# Patient Record
Sex: Female | Born: 1995 | Race: White | Hispanic: No | Marital: Single | Smoking: Current some day smoker
Health system: Southern US, Community
[De-identification: ages and names within clinical notes are randomized; demographics above are authoritative.]

---

## 2018-02-28 ENCOUNTER — Other Ambulatory Visit: Payer: Self-pay

## 2018-02-28 ENCOUNTER — Emergency Department (HOSPITAL_BASED_OUTPATIENT_CLINIC_OR_DEPARTMENT_OTHER)
Admission: EM | Admit: 2018-02-28 | Discharge: 2018-03-01 | Disposition: A | Discharge status: Home / Self Care | Payer: Self-pay | Attending: Emergency Medicine | Admitting: Emergency Medicine

## 2018-02-28 ENCOUNTER — Encounter (HOSPITAL_BASED_OUTPATIENT_CLINIC_OR_DEPARTMENT_OTHER): Payer: Self-pay | Admitting: *Deleted

## 2018-02-28 DIAGNOSIS — T7421XA Adult sexual abuse, confirmed, initial encounter: Secondary | ICD-10-CM | POA: Insufficient documentation

## 2018-02-28 DIAGNOSIS — F17210 Nicotine dependence, cigarettes, uncomplicated: Secondary | ICD-10-CM | POA: Insufficient documentation
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
## 2018-03-01 LAB — POC URINE PREG, ED: PREG TEST UR: NEGATIVE

## 2018-03-01 MED ORDER — LEVONORGESTREL 1.5 MG PO TABS
1.5000 mg | ORAL_TABLET | Freq: Once | ORAL | Status: DC
  Filled 2018-03-01: qty 1

## 2018-03-01 MED ORDER — ULIPRISTAL ACETATE 30 MG PO TABS
30.0000 mg | ORAL_TABLET | Freq: Once | ORAL | Status: AC
  Administered 2018-03-01: 30 mg via ORAL
  Filled 2018-03-01: qty 1

## 2018-03-01 NOTE — ED Notes (Signed)
Pt speaking with SANE RN.
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
## 2018-03-01 NOTE — ED Notes (Signed)
Pt discharged by SANE RN. Pt verbalized understanding of instructions, and was given Dentist by SANE.

## 2018-03-21 ENCOUNTER — Encounter (HOSPITAL_COMMUNITY): Payer: Self-pay

## 2018-03-21 ENCOUNTER — Emergency Department (HOSPITAL_COMMUNITY)
Admission: EM | Admit: 2018-03-21 | Discharge: 2018-03-21 | Disposition: A | Discharge status: Home / Self Care | Payer: Self-pay | Attending: Emergency Medicine | Admitting: Emergency Medicine

## 2018-03-21 DIAGNOSIS — F17210 Nicotine dependence, cigarettes, uncomplicated: Secondary | ICD-10-CM | POA: Insufficient documentation

## 2018-03-21 DIAGNOSIS — F1910 Other psychoactive substance abuse, uncomplicated: Secondary | ICD-10-CM | POA: Insufficient documentation

## 2018-03-21 DIAGNOSIS — F419 Anxiety disorder, unspecified: Secondary | ICD-10-CM | POA: Insufficient documentation

## 2018-03-21 DIAGNOSIS — F329 Major depressive disorder, single episode, unspecified: Secondary | ICD-10-CM | POA: Insufficient documentation

## 2018-03-21 LAB — ACETAMINOPHEN LEVEL

## 2018-03-21 LAB — BASIC METABOLIC PANEL
ANION GAP: 8 (ref 5–15)
BUN: 8 mg/dL (ref 6–20)
CALCIUM: 9.1 mg/dL (ref 8.9–10.3)
CHLORIDE: 105 mmol/L (ref 98–111)
CO2: 26 mmol/L (ref 22–32)
Creatinine, Ser: 0.59 mg/dL (ref 0.44–1.00)
GFR calc Af Amer: >60 mL/min (ref >60)
GFR calc non Af Amer: >60 mL/min (ref >60)
GLUCOSE: 114 mg/dL — AB (ref 70–99)
Potassium: 4 mmol/L (ref 3.5–5.1)
Sodium: 139 mmol/L (ref 135–145)

## 2018-03-21 LAB — CBC WITH DIFFERENTIAL/PLATELET
ABS IMMATURE GRANULOCYTES: 0.02 10*3/uL (ref 0.00–0.07)
Basophils Absolute: 0 10*3/uL (ref 0.0–0.1)
Basophils Relative: 0 %
Eosinophils Absolute: 0.2 10*3/uL (ref 0.0–0.5)
Eosinophils Relative: 3 %
HEMATOCRIT: 37.6 % (ref 36.0–46.0)
HEMOGLOBIN: 11.6 g/dL — AB (ref 12.0–15.0)
IMMATURE GRANULOCYTES: 0 %
LYMPHS ABS: 1.8 10*3/uL (ref 0.7–4.0)
LYMPHS PCT: 26 %
MCH: 26.9 pg (ref 26.0–34.0)
MCHC: 30.9 g/dL (ref 30.0–36.0)
MCV: 87.2 fL (ref 80.0–100.0)
MONOS PCT: 7 %
Monocytes Absolute: 0.5 10*3/uL (ref 0.1–1.0)
NEUTROS ABS: 4.4 10*3/uL (ref 1.7–7.7)
NEUTROS PCT: 64 %
PLATELETS: 368 10*3/uL (ref 150–400)
RBC: 4.31 MIL/uL (ref 3.87–5.11)
RDW: 12.9 % (ref 11.5–15.5)
WBC: 7 10*3/uL (ref 4.0–10.5)
nRBC: 0 % (ref 0.0–0.2)

## 2018-03-21 LAB — I-STAT BETA HCG BLOOD, ED (MC, WL, AP ONLY): I-stat hCG, quantitative: <5 m[IU]/mL (ref <5)

## 2018-03-21 LAB — SALICYLATE LEVEL: Salicylate Lvl: <7 mg/dL (ref 2.8–30.0)

## 2018-03-21 LAB — ETHANOL: Alcohol, Ethyl (B): <10 mg/dL (ref <10)
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
## 2018-03-21 NOTE — ED Triage Notes (Signed)
Patient states she has been detoxing from fentanyl and heroin x2 days. Patient states she overdosed x1 week ago. Patient c/o sweats, diarrhea, nausea, weakness, shob, anxiety. Patient wanting to get help and resources.
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
## 2018-03-21 NOTE — Discharge Instructions (Addendum)
You can follow-up with 1 of the referred outpatient counseling and substance abuse resources.  Additionally, you can follow-up with resources that peers support gave you.  Return to emergency department for any thoughts of wanting to hurt or kill yourself, thoughts of hurting someone else, difficulty breathing, chest pain or any other worsening concerning symptoms.
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
## 2019-05-24 MED ORDER — SIMETHICONE 80 MG PO CHEW
80.00 | CHEWABLE_TABLET | ORAL | Status: DC
Start: ? — End: 2019-05-24

## 2019-05-24 MED ORDER — LOPERAMIDE HCL 2 MG PO CAPS
4.00 | ORAL_CAPSULE | ORAL | Status: DC
Start: ? — End: 2019-05-24

## 2019-05-24 MED ORDER — ALUM & MAG HYDROXIDE-SIMETH 400-400-40 MG/5ML PO SUSP
15.00 | ORAL | Status: DC
Start: ? — End: 2019-05-24

## 2019-05-24 MED ORDER — GENERIC EXTERNAL MEDICATION
5.00 | Status: DC
Start: ? — End: 2019-05-24

## 2019-05-24 MED ORDER — SALINE NASAL SPRAY 0.65 % NA SOLN
1.00 | NASAL | Status: DC
Start: ? — End: 2019-05-24

## 2019-05-24 MED ORDER — DIPHENHYDRAMINE HCL 25 MG PO CAPS
50.00 | ORAL_CAPSULE | ORAL | Status: DC
Start: 2019-05-24 — End: 2019-05-24

## 2019-05-24 MED ORDER — IBUPROFEN 400 MG PO TABS
400.00 | ORAL_TABLET | ORAL | Status: DC
Start: ? — End: 2019-05-24

## 2019-05-24 MED ORDER — BUSPIRONE HCL 10 MG PO TABS
10.00 | ORAL_TABLET | ORAL | Status: DC
Start: 2019-05-24 — End: 2019-05-24

## 2019-05-24 MED ORDER — ZOLPIDEM TARTRATE 5 MG PO TABS
5.00 | ORAL_TABLET | ORAL | Status: DC
Start: ? — End: 2019-05-24

## 2019-05-24 MED ORDER — HALOPERIDOL 5 MG PO TABS
5.00 | ORAL_TABLET | ORAL | Status: DC
Start: ? — End: 2019-05-24

## 2019-05-24 MED ORDER — HYPROMELLOSE 0.5 % OP SOLN
2.00 | OPHTHALMIC | Status: DC
Start: ? — End: 2019-05-24

## 2019-05-24 MED ORDER — NICOTINE POLACRILEX 2 MG MT GUM
2.00 | CHEWING_GUM | OROMUCOSAL | Status: DC
Start: ? — End: 2019-05-24

## 2019-05-24 MED ORDER — ARIPIPRAZOLE 5 MG PO TABS
5.00 | ORAL_TABLET | ORAL | Status: DC
Start: 2019-05-25 — End: 2019-05-24

## 2019-05-24 MED ORDER — CITRATE OF MAGNESIA PO SOLN
300.00 | ORAL | Status: DC
Start: ? — End: 2019-05-24

## 2019-05-24 MED ORDER — NICOTINE 21 MG/24HR TD PT24
1.00 | MEDICATED_PATCH | TRANSDERMAL | Status: DC
Start: 2019-05-25 — End: 2019-05-24

## 2019-05-24 MED ORDER — ACETAMINOPHEN 325 MG PO TABS
650.00 | ORAL_TABLET | ORAL | Status: DC
Start: ? — End: 2019-05-24

## 2019-05-24 MED ORDER — DIPHENHYDRAMINE HCL 25 MG PO CAPS
50.00 | ORAL_CAPSULE | ORAL | Status: DC
Start: ? — End: 2019-05-24

## 2019-05-24 MED ORDER — LORAZEPAM 2 MG/ML IJ SOLN
2.00 | INTRAMUSCULAR | Status: DC
Start: ? — End: 2019-05-24

## 2019-05-24 MED ORDER — INFLUENZA VAC SPLIT QUAD 0.5 ML IM SUSY
0.50 | PREFILLED_SYRINGE | INTRAMUSCULAR | Status: DC
Start: ? — End: 2019-05-24

## 2019-05-24 MED ORDER — DSS 100 MG PO CAPS
100.00 | ORAL_CAPSULE | ORAL | Status: DC
Start: ? — End: 2019-05-24

## 2019-05-24 MED ORDER — LORAZEPAM 1 MG PO TABS
1.00 | ORAL_TABLET | ORAL | Status: DC
Start: ? — End: 2019-05-24

## 2019-05-24 MED ORDER — ZOLPIDEM TARTRATE 5 MG PO TABS
5.00 | ORAL_TABLET | ORAL | Status: DC
Start: 2019-05-24 — End: 2019-05-24

## 2019-05-24 MED ORDER — ONDANSETRON 4 MG PO TBDP
4.00 | ORAL_TABLET | ORAL | Status: DC
Start: ? — End: 2019-05-24

## 2019-05-24 MED ORDER — LORAZEPAM 0.5 MG PO TABS
0.50 | ORAL_TABLET | ORAL | Status: DC
Start: 2019-05-24 — End: 2019-05-24

## 2019-05-24 MED ORDER — SERTRALINE HCL 100 MG PO TABS
100.00 | ORAL_TABLET | ORAL | Status: DC
Start: 2019-05-25 — End: 2019-05-24

## 2019-05-24 MED ORDER — CALCIUM CARBONATE ANTACID 500 MG PO CHEW
CHEWABLE_TABLET | ORAL | Status: DC
Start: ? — End: 2019-05-24

## 2019-05-24 MED ORDER — CLONIDINE HCL 0.1 MG PO TABS
0.10 | ORAL_TABLET | ORAL | Status: DC
Start: ? — End: 2019-05-24

## 2019-05-31 ENCOUNTER — Inpatient Hospital Stay (HOSPITAL_COMMUNITY)
Admission: AD | Admit: 2019-05-31 | Discharge: 2019-06-14 | DRG: 885 | Disposition: A | Discharge status: Home / Self Care | Payer: Medicaid Other | Attending: Psychiatry | Admitting: Psychiatry

## 2019-05-31 ENCOUNTER — Other Ambulatory Visit: Payer: Self-pay

## 2019-05-31 ENCOUNTER — Other Ambulatory Visit: Payer: Self-pay | Admitting: Behavioral Health

## 2019-05-31 DIAGNOSIS — R45851 Suicidal ideations: Secondary | ICD-10-CM | POA: Diagnosis present

## 2019-05-31 DIAGNOSIS — F1290 Cannabis use, unspecified, uncomplicated: Secondary | ICD-10-CM | POA: Diagnosis present

## 2019-05-31 DIAGNOSIS — R251 Tremor, unspecified: Secondary | ICD-10-CM | POA: Diagnosis present

## 2019-05-31 DIAGNOSIS — G4700 Insomnia, unspecified: Secondary | ICD-10-CM | POA: Diagnosis present

## 2019-05-31 DIAGNOSIS — F2081 Schizophreniform disorder: Secondary | ICD-10-CM | POA: Diagnosis present

## 2019-05-31 DIAGNOSIS — F23 Brief psychotic disorder: Secondary | ICD-10-CM | POA: Diagnosis present

## 2019-05-31 DIAGNOSIS — F17210 Nicotine dependence, cigarettes, uncomplicated: Secondary | ICD-10-CM | POA: Diagnosis present

## 2019-05-31 DIAGNOSIS — Z79899 Other long term (current) drug therapy: Secondary | ICD-10-CM | POA: Diagnosis not present

## 2019-05-31 DIAGNOSIS — F4310 Post-traumatic stress disorder, unspecified: Secondary | ICD-10-CM | POA: Diagnosis present

## 2019-05-31 MED ORDER — PNEUMOCOCCAL VAC POLYVALENT 25 MCG/0.5ML IJ INJ: 0.5000 mL | INJECTION | INTRAMUSCULAR | Status: DC

## 2019-05-31 MED ORDER — ACETAMINOPHEN 325 MG PO TABS
650.0000 mg | ORAL_TABLET | Freq: Four times a day (QID) | ORAL | Status: DC | PRN
  Administered 2019-06-02 – 2019-06-13 (×3): 650 mg via ORAL
  Filled 2019-05-31 (×3): qty 2

## 2019-05-31 MED ORDER — ENSURE ENLIVE PO LIQD
237.0000 mL | Freq: Two times a day (BID) | ORAL | Status: DC
  Administered 2019-06-02 – 2019-06-14 (×22): 237 mL via ORAL

## 2019-05-31 MED ORDER — NICOTINE 21 MG/24HR TD PT24
21.0000 mg | MEDICATED_PATCH | Freq: Every day | TRANSDERMAL | Status: DC
  Administered 2019-06-01 – 2019-06-14 (×14): 21 mg via TRANSDERMAL
  Filled 2019-05-31 (×17): qty 1

## 2019-05-31 MED ORDER — HYDROXYZINE HCL 25 MG PO TABS
25.0000 mg | ORAL_TABLET | Freq: Three times a day (TID) | ORAL | Status: DC | PRN
  Administered 2019-05-31 – 2019-06-10 (×17): 25 mg via ORAL
  Filled 2019-05-31 (×19): qty 1

## 2019-05-31 MED ORDER — TRAZODONE HCL 50 MG PO TABS
50.0000 mg | ORAL_TABLET | Freq: Every day | ORAL | Status: DC
  Administered 2019-05-31 – 2019-06-09 (×8): 50 mg via ORAL
  Filled 2019-05-31 (×16): qty 1

## 2019-05-31 NOTE — BH Assessment (Signed)
Assessment Note From [place] chart:  Pt is a 24 year old female who presented to [facility] on a voluntary basis (transported by father) after being discharged from [facility] due to continued suicidal ideation and other symptoms.  Pt was assessed by TTS on 05/16/19.  At that time, she presented to [facility] with report of hallucination and suicidal ideation.  She also tested positive for amphetamines and benzos.  Pt stated that she lives in [place] with her boyfriend Corene Cornea, but it was difficult to understand Pt.  Pt was very tearful and tremulous during assessment.  She reported that she is suicidal with plan to drink bleach.  Pt also reported that she has thought about drinking bleach in the past.  Pt also reported ongoing visual hallucinations (''demons'') and auditory hallucination (voices urging her to harm herself).  Pt also endorsed despondency, insomnia, poor appetite, feelings of worthlessness.  Pt spoke very rapidly, and thought content was tangential and not responsive to questions asked.  Pt appeared confused and was not oriented to time.    In previous assessment, Pt was positive for amphetamines.  Pt's UDS was not available at time of assessment.  During assessment, Pt presented as alert.  She had fair eye contact.  Pt's demeanor was agitated.  Pt's mood was depressed.  Affect was panicky and tearful. Pt's speech was rapid and indicated word salad.  Thought content was tangential.  Pt's memory and concentration seemed fair.  Insight, judgment, and impulse control were impaired.  Onset: Over a week ago Medication Compliance: Yes Reason for seeking treatment: Family (Transported to hospital by father.) Presented With: Reports: Depression, Unclear Thinking, Bizarre Behavior, Suicidal Ideation, Visual Hallucinations, Auditory Hallucinations Apperance: Casual, Underweight, Stated Age Attitude: Bizarre Mood: Sad, Anxious, Excited Affect: Depressed,  Anxious Insight: Impaired Judgement: Impaired Memory Description: Reports: Recent Impaired, Remote Impaired Depressive Symptoms: Reports: Crying episodes, Poor Concentration, Sleep changes, Worthlessness, Despondent, Insomnia, Self-pity Manic/Hypomanic Symptoms: Reports: Decreased Coping Skills Suicidal Attempt: Reports: Other Suicidal Intent: Reports: Suicidal Plan Suicidal Plan: Reports: Other (Poison self with bleach) Risk Factors: Reports: Psychiatric Diagnosis and Treatment, Family History of Suicide or Mental Illness, Substance Abuse and Dependence, Psychosis: Hallucinations or Delusions Risk for physical violence towards others: Reports: Not an issue Homicidal Ideation: No Does patient have access to weapons?: No Criminal charges pending: No Court Date (if yes when): No Hallucination Type: Reports: Visual, Auditory, Command Hallucinations affecting more than one sensory system: Yes History of Abuse: Yes: Having thoughts of harming yourself or taking your life?, Hx Substance Use Disorder Hx Substance Use Treatment: UNKNOWN Able to Care for Self: No Able to Control Self: No  - Medical History Cardiac History: Reports: Cardiac Catheterization Psychological History: Reports: Substance Use Disorder.  Denies: Depression Social History: Reports: Substance Use Disorder.  Denies: Tobacco Use in the Last 30 Days Surgical History: Reports: Cardiac Catheterization.  Denies: Hysterectomy  - Diagnosis Brief Psychotic Disorder; r/o Bipolar I; r/o MDD with psychotic features; r/o amphetamine use disorder  - Disposition:  Consulted with T. Money, NP who determined that Pt meets inpatient criteria. Diagnosis - Patient Problems:  Current Active Problems  Bizarre behavior (Acute) R46.2 Suicidal ideation (Acute) R45.851   Does the patient meet criteria for Involuntary Commitment?: N/A Recommend /or Refer: Inpatient Therapy Action/Disposition Plan:  Consulted with T. Money, NP who  determined that Pt meets inpatient criteria. Lakin Rhine Grove is an 24 y.o. female who presented to [facility] on a voluntary basis (transported by father) after being discharged  from [facility] due to continued suicidal ideation and other symptoms.     Diagnosis: Brief Psychotic Disorder; r/o Bipolar I; r/o MDD with psychotic features; r/o amphetamine use disorder  Past Medical History: No past medical history on file.  No past surgical history on file.  Family History: No family history on file.  Social History:  reports that she has been smoking cigarettes. She has never used smokeless tobacco. She reports previous alcohol use. She reports current drug use. Frequency: 7.00 times per week. Drugs: Fentanyl and Heroin.  Additional Social History:  Alcohol / Drug Use Pain Medications: See MAR Prescriptions: See MAR Over the Counter: See MAR History of alcohol / drug use?: Yes Longest period of sobriety (when/how long): UTA Negative Consequences of Use: Work / School Substance #1 Name of Substance 1: THC 1 - Frequency: UTA Substance #2 Name of Substance 2: Amphetamines 2 - Frequency: UTA Substance #3 Name of Substance 3: benzos 3 - Frequency: UTA 3 - Last Use / Amount: UTA  CIWA:   COWS:    Allergies: No Known Allergies  Home Medications:  No medications prior to admission.    OB/GYN Status:  No LMP recorded. (Menstrual status: Irregular Periods).  General Assessment Data Location of Assessment: [facility] TTS Assessment: Out of system Is this a Tele or Face-to-Face Assessment?: Tele Assessment Is this an Initial Assessment or a Re-assessment for this encounter?: Initial Assessment Patient Accompanied by:: N/A Language Other than English: No Living Arrangements: Other (Comment) What gender do you identify as?: Female Marital status: Single Living Arrangements: Non-relatives/Friends(with boyfriend, Barbara Cower) Can pt return to  current living arrangement?: (UTA) Admission Status: Voluntary Is patient capable of signing voluntary admission?: Yes Referral Source: Self/Family/Friend Insurance type: medicaid     Crisis Care Plan Living Arrangements: Non-relatives/Friends(with boyfriend, Barbara Cower) Name of Psychiatrist: none  Education Status Is patient currently in school?: No Is the patient employed, unemployed or receiving disability?: Unemployed  Risk to self with the past 6 months Suicidal Ideation: Yes-Currently Present Has patient been a risk to self within the past 6 months prior to admission? : No Suicidal Intent: No-Not Currently/Within Last 6 Months Has patient had any suicidal intent within the past 6 months prior to admission? : Yes Is patient at risk for suicide?: Yes Suicidal Plan?: Yes-Currently Present(drink bleach) Specify Current Suicidal Plan: drink bleach What has been your use of drugs/alcohol within the last 12 months?: meth, benzos and thc Previous Attempts/Gestures: (UTA) Other Self Harm Risks: psychosis; substance abuse, current SI Triggers for Past Attempts: Unknown Intentional Self Injurious Behavior: (UTA) Family Suicide History: Unable to assess Recent stressful life event(s): (drug use) Persecutory voices/beliefs?: Yes Depression: Yes Depression Symptoms: Despondent, Insomnia, Isolating, Feeling worthless/self pity, Loss of interest in usual pleasures, Tearfulness Substance abuse history and/or treatment for substance abuse?: Yes Suicide prevention information given to non-admitted patients: Not applicable  Risk to Others within the past 6 months Homicidal Ideation: No Does patient have any lifetime risk of violence toward others beyond the six months prior to admission? : No Thoughts of Harm to Others: No Current Homicidal Intent: No Current Homicidal Plan: No Access to Homicidal Means: No History of harm to others?: No Assessment of Violence: None Noted Does patient have  access to weapons?: (UTA) Criminal Charges Pending?: No Does patient have a court date: No Is patient on probation?: No  Psychosis Hallucinations: Auditory, Visual Delusions: Unspecified  Mental Status Report Appearance/Hygiene: Disheveled Eye Contact: Fair Motor Activity: Restlessness Speech: Incoherent, Pressured  Level of Consciousness: Restless, Crying Mood: Sad Affect: Sad Anxiety Level: Moderate Thought Processes: Tangential Judgement: Impaired Orientation: Person, Place, Situation Obsessive Compulsive Thoughts/Behaviors: None  Cognitive Functioning Concentration: Poor Memory: Unable to Assess Is patient IDD: No Insight: Poor Impulse Control: Poor Sleep: Decreased Vegetative Symptoms: Unable to Assess  ADLScreening [facility] Assessment Services) Patient's cognitive ability adequate to safely complete daily activities?: Yes Patient able to express need for assistance with ADLs?: Yes Independently performs ADLs?: Yes (appropriate for developmental age)  Prior Inpatient Therapy Prior Inpatient Therapy: Yes Prior Therapy Dates: 1/21 Prior Therapy Facilty/Provider(s): [facility] Reason for Treatment: psychosis/depression  Prior Outpatient Therapy Prior Outpatient Therapy: No Does patient have an ACCT team?: No Does patient have Intensive In-House Services?  : No Does patient have [facility] services? : No Does patient have P4CC services?: No  ADL Screening (condition at time of admission) Patient's cognitive ability adequate to safely complete daily activities?: Yes Is the patient deaf or have difficulty hearing?: No Does the patient have difficulty seeing, even when wearing glasses/contacts?: No Does the patient have difficulty concentrating, remembering, or making decisions?: Yes Patient able to express need for assistance with ADLs?: Yes Does the patient have difficulty dressing or bathing?: No Independently performs ADLs?: Yes (appropriate for developmental  age) Does the patient have difficulty walking or climbing stairs?: No Weakness of Legs: None Weakness of Arms/Hands: None  Home Assistive Devices/Equipment Home Assistive Devices/Equipment: None  Therapy Consults (therapy consults require a physician order) PT Evaluation Needed: No OT Evalulation Needed: No SLP Evaluation Needed: No Abuse/Neglect Assessment (Assessment to be complete while patient is alone) Abuse/Neglect Assessment Can Be Completed: Unable to assess, patient is non-responsive or altered mental status Values / Beliefs Cultural Requests During Hospitalization: None Spiritual Requests During Hospitalization: None Consults Spiritual Care Consult Needed: No Transition of Care Team Consult Needed: No Advance Directives (For Healthcare) Does Patient Have a Medical Advance Directive?: No Would patient like information on creating a medical advance directive?: No - Patient declined          Disposition: Consulted with T. Money, NP who determined that Pt meets inpatient criteria. Disposition Initial Assessment Completed for this Encounter: Yes Disposition of Patient: Admit  On Site Evaluation by:   Reviewed with Physician:    Clearnce Sorrel 05/31/2019 5:07 PM

## 2019-06-01 ENCOUNTER — Encounter (HOSPITAL_COMMUNITY): Payer: Self-pay | Admitting: Psychiatry

## 2019-06-01 DIAGNOSIS — F23 Brief psychotic disorder: Secondary | ICD-10-CM

## 2019-06-01 DIAGNOSIS — F2081 Schizophreniform disorder: Secondary | ICD-10-CM

## 2019-06-01 LAB — LIPID PANEL
Cholesterol: 178 mg/dL (ref 0–200)
HDL: 54 mg/dL (ref >40)
LDL Cholesterol: 112 mg/dL — ABNORMAL HIGH (ref 0–99)
Total CHOL/HDL Ratio: 3.3 RATIO
Triglycerides: 59 mg/dL (ref <150)
VLDL: 12 mg/dL (ref 0–40)

## 2019-06-01 LAB — TSH: TSH: 1.32 u[IU]/mL (ref 0.350–4.500)

## 2019-06-01 MED ORDER — TEMAZEPAM 15 MG PO CAPS
15.0000 mg | ORAL_CAPSULE | Freq: Every evening | ORAL | Status: DC | PRN
  Administered 2019-06-01 – 2019-06-02 (×2): 15 mg via ORAL
  Filled 2019-06-01 (×2): qty 1

## 2019-06-01 MED ORDER — PRENATAL MULTIVITAMIN CH
1.0000 | ORAL_TABLET | Freq: Every day | ORAL | Status: DC
  Administered 2019-06-01 – 2019-06-14 (×14): 1 via ORAL
  Filled 2019-06-01 (×18): qty 1

## 2019-06-01 MED ORDER — BENZTROPINE MESYLATE 0.5 MG PO TABS
0.5000 mg | ORAL_TABLET | Freq: Two times a day (BID) | ORAL | Status: DC
  Administered 2019-06-01 – 2019-06-05 (×8): 0.5 mg via ORAL
  Filled 2019-06-01 (×13): qty 1

## 2019-06-01 MED ORDER — OMEGA-3-ACID ETHYL ESTERS 1 G PO CAPS
1.0000 g | ORAL_CAPSULE | Freq: Two times a day (BID) | ORAL | Status: DC
  Administered 2019-06-01 – 2019-06-14 (×26): 1 g via ORAL
  Filled 2019-06-01 (×33): qty 1

## 2019-06-01 MED ORDER — RISPERIDONE 2 MG PO TABS
2.0000 mg | ORAL_TABLET | Freq: Two times a day (BID) | ORAL | Status: DC
  Administered 2019-06-01 (×2): 2 mg via ORAL
  Filled 2019-06-01 (×5): qty 1
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
## 2019-06-01 NOTE — Progress Notes (Signed)
Recreation Therapy Notes  INPATIENT RECREATION THERAPY ASSESSMENT  Patient Details Name: Keera Altidor MRN: [id number] DOB: [birth year] Today's Date: 06/01/2019       Information Obtained From: Patient  Able to Participate in Assessment/Interview: Yes  Patient Presentation: Labile  Reason for Admission (Per Patient): Other (Comments), Substance Abuse("they think I'm crazy")  Patient Stressors: (Drugs)  Coping Skills:   Isolation, TV, Aggression, Music, Exercise, Meditate, Deep Breathing, Substance Abuse, Impulsivity, Art, Prayer, Avoidance, Read  Leisure Interests (2+):  Individual - Other (Comment)(Drink water/milk)  Frequency of Recreation/Participation: Other (Comment)(Daily)  Awareness of Community Resources:  Yes  Community Resources:  Gym  Current Use: No  If no, Barriers?: Other (Comment)(Pt stated she hadn't started going yet.)  Expressed Interest in [employer] Information: No  [place] of Residence:  [place]  Patient Main Form of Transportation: Walk  Patient Strengths:  Banker; Meditation  Patient Identified Areas of Improvement:  Teach self how to breath; Don't roll eyes  Patient Goal for Hospitalization:  "clean my ears out and I need eye drops"  Current SI (including self-harm):  No  Current HI:  No  Current AVH: No  Staff Intervention Plan: Group Attendance, Collaborate with Interdisciplinary Treatment Team  Consent to Intern Participation: N/A     Caroll Rancher, LRT/CTRS  Lillia Abed, Anwar Crill A 06/01/2019, 11:58 AM

## 2019-06-01 NOTE — Tx Team (Signed)
Initial Treatment Plan 06/01/2019 2:36 AM Julie Gentry [facility]:[id number]    PATIENT STRESSORS: Legal issue Medication change or noncompliance   PATIENT STRENGTHS: Average or above average intelligence Physical Health Supportive family/friends   PATIENT IDENTIFIED PROBLEMS: Depression  Suicidal ideation  Anxiety  Medication non-compliance                DISCHARGE CRITERIA:  Improved stabilization in mood, thinking, and/or behavior Motivation to continue treatment in a less acute level of care Verbal commitment to aftercare and medication compliance  PRELIMINARY DISCHARGE PLAN: Attend PHP/IOP Outpatient therapy Return to previous living arrangement  PATIENT/FAMILY INVOLVEMENT: This treatment plan has been presented to and reviewed with the patient, Julie Gentry, and/or family member.  The patient and family have been given the opportunity to ask questions and make suggestions.  Victorino December, RN 06/01/2019, 2:36 AM
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
## 2019-06-01 NOTE — Tx Team (Signed)
Interdisciplinary Treatment and Diagnostic Plan Update  06/01/2019 Time of Session: 10:30 am  Julie Gentry MRN: [id number]  Principal Diagnosis: <principal problem not specified>  Secondary Diagnoses: Active Problems:   Brief psychotic disorder ([facility])   Schizophreniform disorder ([facility])   Current Medications:  Current Facility-Administered Medications  Medication Dose Route Frequency Provider Last Rate Last Admin  . acetaminophen (TYLENOL) tablet 650 mg  650 mg Oral Q6H PRN Anike, Adaku C, NP      . benztropine (COGENTIN) tablet 0.5 mg  0.5 mg Oral BID Malvin Johns, MD   0.5 mg at 06/01/19 0855  . feeding supplement (ENSURE ENLIVE) (ENSURE ENLIVE) liquid 237 mL  237 mL Oral BID BM Malvin Johns, MD      . hydrOXYzine (ATARAX/VISTARIL) tablet 25 mg  25 mg Oral TID PRN Anike, Adaku C, NP   25 mg at 05/31/19 2255  . nicotine (NICODERM CQ - dosed in mg/24 hours) patch 21 mg  21 mg Transdermal Daily Malvin Johns, MD   21 mg at 06/01/19 0855  . omega-3 acid ethyl esters (LOVAZA) capsule 1 g  1 g Oral BID Malvin Johns, MD   1 g at 06/01/19 0855  . pneumococcal 23 valent vaccine (PNEUMOVAX-23) injection 0.5 mL  0.5 mL Intramuscular Tomorrow-1000 Malvin Johns, MD      . prenatal multivitamin tablet 1 tablet  1 tablet Oral Daily Malvin Johns, MD   1 tablet at 06/01/19 0855  . risperiDONE (RISPERDAL) tablet 2 mg  2 mg Oral BID Malvin Johns, MD   2 mg at 06/01/19 0855  . temazepam (RESTORIL) capsule 15 mg  15 mg Oral QHS PRN Malvin Johns, MD      . traZODone (DESYREL) tablet 50 mg  50 mg Oral QHS Anike, Adaku C, NP   50 mg at 05/31/19 2228   PTA Medications: Medications Prior to Admission  Medication Sig Dispense Refill Last Dose  . ARIPiprazole (ABILIFY) 5 MG tablet Take 1 tablet by mouth daily.     . busPIRone (BUSPAR) 10 MG tablet Take 1 tablet by mouth 2 (two) times daily.     . hydrOXYzine (ATARAX/VISTARIL) 50 MG tablet Take 1 tablet by mouth 3 (three) times daily.     . sertraline  (ZOLOFT) 100 MG tablet Take 1 tablet by mouth daily.       Patient Stressors: Legal issue Medication change or noncompliance  Patient Strengths: Average or above average intelligence Physical Health Supportive family/friends  Treatment Modalities: Medication Management, Group therapy, Case management,  1 to 1 session with clinician, Psychoeducation, Recreational therapy.   Physician Treatment Plan for Primary Diagnosis: <principal problem not specified> Long Term Goal(s): Improvement in symptoms so as ready for discharge Improvement in symptoms so as ready for discharge   Short Term Goals: Ability to identify and develop effective coping behaviors will improve Ability to maintain clinical measurements within normal limits will improve Compliance with prescribed medications will improve Ability to identify and develop effective coping behaviors will improve Ability to maintain clinical measurements within normal limits will improve Compliance with prescribed medications will improve  Medication Management: Evaluate patient's response, side effects, and tolerance of medication regimen.  Therapeutic Interventions: 1 to 1 sessions, Unit Group sessions and Medication administration.  Evaluation of Outcomes: Progressing  Physician Treatment Plan for Secondary Diagnosis: Active Problems:   Brief psychotic disorder ([facility])   Schizophreniform disorder ([facility])  Long Term Goal(s): Improvement in symptoms so as ready for discharge Improvement in symptoms so as ready for discharge  Short Term Goals: Ability to identify and develop effective coping behaviors will improve Ability to maintain clinical measurements within normal limits will improve Compliance with prescribed medications will improve Ability to identify and develop effective coping behaviors will improve Ability to maintain clinical measurements within normal limits will improve Compliance with prescribed medications will  improve     Medication Management: Evaluate patient's response, side effects, and tolerance of medication regimen.  Therapeutic Interventions: 1 to 1 sessions, Unit Group sessions and Medication administration.  Evaluation of Outcomes: Progressing   RN Treatment Plan for Primary Diagnosis: <principal problem not specified> Long Term Goal(s): Knowledge of disease and therapeutic regimen to maintain health will improve  Short Term Goals: Ability to participate in decision making will improve, Ability to verbalize feelings will improve, Ability to disclose and discuss suicidal ideas, Ability to identify and develop effective coping behaviors will improve and Compliance with prescribed medications will improve  Medication Management: RN will administer medications as ordered by provider, will assess and evaluate patient's response and provide education to patient for prescribed medication. RN will report any adverse and/or side effects to prescribing provider.  Therapeutic Interventions: 1 on 1 counseling sessions, Psychoeducation, Medication administration, Evaluate responses to treatment, Monitor vital signs and CBGs as ordered, Perform/monitor CIWA, COWS, AIMS and Fall Risk screenings as ordered, Perform wound care treatments as ordered.  Evaluation of Outcomes: Progressing   LCSW Treatment Plan for Primary Diagnosis: <principal problem not specified> Long Term Goal(s): Safe transition to appropriate next level of care at discharge, Engage patient in therapeutic group addressing interpersonal concerns.  Short Term Goals: Engage patient in aftercare planning with referrals and resources and Increase skills for wellness and recovery  Therapeutic Interventions: Assess for all discharge needs, 1 to 1 time with Social worker, Explore available resources and support systems, Assess for adequacy in community support network, Educate family and significant other(s) on suicide prevention, Complete  Psychosocial Assessment, Interpersonal group therapy.  Evaluation of Outcomes: Progressing   Progress in Treatment: Attending groups: No. new to unit  Participating in groups: No. Taking medication as prescribed: No. new to unit  Toleration medication: No. Family/Significant other contact made: No, will contact:  if given consent  Patient understands diagnosis: Yes. Discussing patient identified problems/goals with staff: Yes. Medical problems stabilized or resolved: Yes. Denies suicidal/homicidal ideation: Yes. Issues/concerns per patient self-inventory: No. Other:   New problem(s) identified: No, Describe:  none   New Short Term/Long Term Goal(s): Medication stabilization, elimination of SI thoughts, and development of a comprehensive mental wellness plan.   Patient Goals:  "to get better"  Discharge Plan or Barriers: CSW will continue to follow up for appropriate referrals and possible discharge planning  Reason for Continuation of Hospitalization: Hallucinations Medication stabilization Suicidal ideation  Estimated Length of Stay: 3-5 days   Attendees: Patient: Julie Gentry  06/01/2019   Physician:  06/01/2019   Nursing:  06/01/2019   RN Care Manager: 06/01/2019   Social Worker: Ardelle Anton, LCSW 06/01/2019   Recreational Therapist:  06/01/2019   Other: Ovidio Kin, MSW intern  06/01/2019   Other:  06/01/2019   Other: 06/01/2019     Scribe for Treatment Team: Billey Chang, Student-Social Work 06/01/2019 10:50 AM

## 2019-06-01 NOTE — Progress Notes (Signed)
MSW intern attempted to complete assessment with pt. Pt made random statements while mumbling and began pacing the room.   Earlyne Iba, MSW intern
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
## 2019-06-01 NOTE — Progress Notes (Signed)
Patient has been complaining of active hallucinations.  Patient has been pacing the hall with a fixed smile on her face giggling and inappropriately at times. Patient has had no incidents of behavioral dyscontrol.   Assess patient for safety, engage patient in 1:1 staff talks.   Continue to monitor as planned.

## 2019-06-01 NOTE — Progress Notes (Signed)
Nutrition Brief Note RD working remotely.   Patient identified on the Malnutrition Screening Tool (MST) Report.  Wt Readings from Last 15 Encounters:  05/31/19 47.5 kg  02/28/18 47.6 kg    Body mass index is 19.15 kg/m. Patient meets criteria for normal weight based on current BMI. Weight has been stable for >1 year.   Current diet order is Regular and patient is eating as desired for meals and snacks. Labs and medications reviewed.   Ensure Enlive was ordered BID per ONS protocol, each supplement provides 350 kcal and 20 grams protein. No nutrition interventions warranted at this time. If nutrition issues arise, please consult RD.     Trenton Gammon, MS, RD, LDN, CNSC Inpatient Clinical Dietitian RD pager # available in [facility]  After hours/weekend pager # available in [facility]

## 2019-06-01 NOTE — Progress Notes (Signed)
Recreation Therapy Notes  Date: 2.10.21 Time: 1000 Location: [address] Dayroom  Group Topic: Communication  Goal Area(s) Addresses:  Patient will effectively communicate with peers in group.  Patient will verbalize benefit of healthy communication.  Intervention: Group Discussion  Activity: Check-In.  Patients were to discuss any concerns or positive achievements they may have.  Education: Communication, Discharge Planning  Education Outcome: Acknowledges understanding/In group clarification offered/Needs additional education.   Clinical Observations/Feedback: Pt did not attend group.    Caroll Rancher, LRT/CTRS         Caroll Rancher A 06/01/2019 11:46 AM

## 2019-06-01 NOTE — Progress Notes (Signed)
Pt states that she was given prescriptions for meds but didn't fill them, stating "I don't know why."  ARIPiprazole (ABILIFY) 5 MG tablet   Take 1 tablet (5 mg total) by mouth daily. 30 tablet    busPIRone (BUSPAR) 10 MG tablet   Take 1 tablet (10 mg total) by mouth Two (2) times a day. 60 tablet    hydroxyzine (ATARAX) 50 MG tablet   Take 1 tablet (50 mg total) by mouth Three (3) times a day. 90 capsule    sertraline (ZOLOFT) 100 MG tablet   Take 1 tablet (100 mg total) by mouth daily. 30 tablet     This is the med list of home meds per records from [facility].

## 2019-06-02 LAB — HEMOGLOBIN A1C
Hgb A1c MFr Bld: 5.1 % (ref 4.8–5.6)
Mean Plasma Glucose: 100 mg/dL

## 2019-06-02 LAB — PROLACTIN: Prolactin: 12.7 ng/mL (ref 4.8–23.3)

## 2019-06-02 MED ORDER — RISPERIDONE 3 MG PO TABS
3.0000 mg | ORAL_TABLET | Freq: Two times a day (BID) | ORAL | Status: DC
  Administered 2019-06-02 – 2019-06-03 (×3): 3 mg via ORAL
  Filled 2019-06-02 (×6): qty 1

## 2019-06-02 NOTE — Progress Notes (Addendum)
[facility] MD Progress Note  06/02/2019 7:36 AM Julie Gentry  MRN:  [id number] Principal Problem: <principal problem not specified> Diagnosis: Active Problems:   Brief psychotic disorder (HCC)   Schizophreniform disorder (HCC)  Subjective:  Julie Gentry is a 24 year old female that presents this morning with visual and auditory hallucinations. She says that she frequently sees shadows that trigger her to react violently. She is also still experiencing auditory hallucinations that range from voices "talking crap" to simply whistling. She states this is very distracting and she paces to keep her mind busy. She has attached herself to another patient and it appears they are feeding into each others delusions. Patient has poor insight, judgement, and attention. She denies any current suicidal ideations; however, does state that her suicidal thoughts normally come randomly and without trigger. She admits to substance abuse; however, states she does not feel like she's having any withdrawal symptoms at this time.  Total Time spent with patient: 15 minutes  Past Psychiatric History: Inpatient psychiatric hospitalization on 05/16/2019  Past Medical History: History reviewed. No pertinent past medical history. History reviewed. No pertinent surgical history. Family History: History reviewed. No pertinent family history. Family Psychiatric  History: History reviewed. No pertinent family history.  Social History:  Social History   Substance and Sexual Activity  Alcohol Use Not Currently     Social History   Substance and Sexual Activity  Drug Use Yes  . Types: Marijuana   Comment: occasionally    Social History   Socioeconomic History  . Marital status: Single    Spouse name: Not on file  . Number of children: Not on file  . Years of education: 36  . Highest education level: High school graduate  Occupational History  . Occupation: Unemployed  Tobacco Use  . Smoking status: Current  Some Day Smoker    Packs/day: 1.00    Types: Cigarettes  . Smokeless tobacco: Never Used  Substance and Sexual Activity  . Alcohol use: Not Currently  . Drug use: Yes    Types: Marijuana    Comment: occasionally  . Sexual activity: Yes    Birth control/protection: None  Other Topics Concern  . Not on file  Social History Narrative   Pt is unemployed; lives in [place] with her father   Social Determinants of Health   Financial Resource Strain:   . Difficulty of Paying Living Expenses: Not on file  Food Insecurity:   . Worried About Programme researcher, broadcasting/film/video in the Last Year: Not on file  . Ran Out of Food in the Last Year: Not on file  Transportation Needs:   . Lack of Transportation (Medical): Not on file  . Lack of Transportation (Non-Medical): Not on file  Physical Activity:   . Days of Exercise per Week: Not on file  . Minutes of Exercise per Session: Not on file  Stress:   . Feeling of Stress : Not on file  Social Connections:   . Frequency of Communication with Friends and Family: Not on file  . Frequency of Social Gatherings with Friends and Family: Not on file  . Attends Religious Services: Not on file  . Active Member of Clubs or Organizations: Not on file  . Attends Banker Meetings: Not on file  . Marital Status: Not on file   Additional Social History:    Pain Medications: See MAR Prescriptions: See MAR Over the Counter: See MAR History of alcohol / drug use?: Yes Longest period of sobriety (when/how  long): UTA Negative Consequences of Use: Work / School Name of Substance 1: THC 1 - Frequency: UTA Name of Substance 2: Amphetamines 2 - Frequency: UTA Name of Substance 3: benzos 3 - Frequency: UTA 3 - Last Use / Amount: UTA      Sleep: Good  Appetite:  Fair  Current Medications: Current Facility-Administered Medications  Medication Dose Route Frequency Provider Last Rate Last Admin  . acetaminophen (TYLENOL) tablet 650 mg  650 mg Oral Q6H  PRN Anike, Adaku C, NP      . benztropine (COGENTIN) tablet 0.5 mg  0.5 mg Oral BID Malvin Johns, MD   0.5 mg at 06/01/19 1705  . feeding supplement (ENSURE ENLIVE) (ENSURE ENLIVE) liquid 237 mL  237 mL Oral BID BM Malvin Johns, MD      . hydrOXYzine (ATARAX/VISTARIL) tablet 25 mg  25 mg Oral TID PRN Anike, Adaku C, NP   25 mg at 06/01/19 2226  . nicotine (NICODERM CQ - dosed in mg/24 hours) patch 21 mg  21 mg Transdermal Daily Malvin Johns, MD   21 mg at 06/01/19 0855  . omega-3 acid ethyl esters (LOVAZA) capsule 1 g  1 g Oral BID Malvin Johns, MD   1 g at 06/01/19 1705  . pneumococcal 23 valent vaccine (PNEUMOVAX-23) injection 0.5 mL  0.5 mL Intramuscular Tomorrow-1000 Malvin Johns, MD      . prenatal multivitamin tablet 1 tablet  1 tablet Oral Daily Malvin Johns, MD   1 tablet at 06/01/19 0855  . risperiDONE (RISPERDAL) tablet 3 mg  3 mg Oral BID Malvin Johns, MD      . temazepam (RESTORIL) capsule 15 mg  15 mg Oral QHS PRN Malvin Johns, MD   15 mg at 06/01/19 2043  . traZODone (DESYREL) tablet 50 mg  50 mg Oral QHS Anike, Adaku C, NP   50 mg at 06/01/19 2043    Lab Results:  Results for orders placed or performed during the hospital encounter of 05/31/19 (from the past 48 hour(s))  Hemoglobin A1c     Status: None   Collection Time: 06/01/19  6:12 AM  Result Value Ref Range   Hgb A1c MFr Bld 5.1 4.8 - 5.6 %    Comment: (NOTE)         Prediabetes: 5.7 - 6.4         Diabetes: >6.4         Glycemic control for adults with diabetes: <7.0    Mean Plasma Glucose 100 mg/dL    Comment: (NOTE) Performed At: [facility] [address] [place], [place] [id number] Jolene Schimke MD VO:[id number]   Lipid panel     Status: Abnormal   Collection Time: 06/01/19  6:12 AM  Result Value Ref Range   Cholesterol 178 0 - 200 mg/dL   Triglycerides 59 <993 mg/dL   HDL 54 >71 mg/dL   Total CHOL/HDL Ratio 3.3 RATIO   VLDL 12 0 - 40 mg/dL   LDL Cholesterol 696 (H) 0 - 99 mg/dL    Comment:         Total Cholesterol/HDL:CHD Risk Coronary Heart Disease Risk Table                     Men   Women  1/2 Average Risk   3.4   3.3  Average Risk       5.0   4.4  2 X Average Risk   9.6   7.1  3 X Average  Risk  23.4   11.0        Use the calculated Patient Ratio above and the CHD Risk Table to determine the patient's CHD Risk.        ATP III CLASSIFICATION (LDL):  <100     mg/dL   Optimal  240-973  mg/dL   Near or Above                    Optimal  130-159  mg/dL   Borderline  532-992  mg/dL   High  >426     mg/dL   Very High Performed at [facility], [address], [place], [place] [address]   Prolactin     Status: None   Collection Time: 06/01/19  6:12 AM  Result Value Ref Range   Prolactin 12.7 4.8 - 23.3 ng/mL    Comment: (NOTE) Performed At: [facility] [address] [place], [place] [id number] Jolene Schimke MD QJ:[id number]   TSH     Status: None   Collection Time: 06/01/19  6:12 AM  Result Value Ref Range   TSH 1.320 0.350 - 4.500 uIU/mL    Comment: Performed by a 3rd Generation assay with a functional sensitivity of <=0.01 uIU/mL. Performed at [facility], [address] [place], [place] [address]     Blood Alcohol level:  Lab Results  Component Value Date   ETH <10 03/21/2018    Metabolic Disorder Labs: Lab Results  Component Value Date   HGBA1C 5.1 06/01/2019   MPG 100 06/01/2019   Lab Results  Component Value Date   PROLACTIN 12.7 06/01/2019   Lab Results  Component Value Date   CHOL 178 06/01/2019   TRIG 59 06/01/2019   HDL 54 06/01/2019   CHOLHDL 3.3 06/01/2019   VLDL 12 06/01/2019   LDLCALC 112 (H) 06/01/2019    Physical Findings: AIMS: Facial and Oral Movements Muscles of Facial Expression: None, normal Lips and Perioral Area: None, normal Jaw: None, normal Tongue: None, normal,Extremity Movements Upper (arms, wrists, hands, fingers): None, normal Lower (legs, knees, ankles,  toes): None, normal, Trunk Movements Neck, shoulders, hips: None, normal, Overall Severity Severity of abnormal movements (highest score from questions above): None, normal Incapacitation due to abnormal movements: None, normal Patient's awareness of abnormal movements (rate only patient's report): No Awareness, Dental Status Current problems with teeth and/or dentures?: No Does patient usually wear dentures?: No  CIWA:    COWS:     Musculoskeletal: Strength & Muscle Tone: within normal limits Gait & Station: normal Patient leans: N/A  Psychiatric Specialty Exam: Physical Exam  Review of Systems  Blood pressure 119/84, pulse (!) 114, temperature 97.7 F (36.5 C), temperature source Oral, resp. rate 18, height 5\' 2"  (1.575 m), weight 47.5 kg.Body mass index is 19.15 kg/m.  General Appearance: Casual  Eye Contact:  Fair  Speech:  Clear and Coherent  Volume:  Decreased  Mood:  Euthymic  Affect:  Flat  Thought Process:  Disorganized, Irrelevant and Descriptions of Associations: Loose  Orientation:  Other:  Person, Place, but unsure of date/year  Thought Content:  Illogical and Hallucinations: Auditory Visual  Suicidal Thoughts:  None this morning but expressed previous thoughts of self-harm  Homicidal Thoughts:  No  Memory:  Immediate;   Poor Recent;   Poor  Judgement:  Impaired  Insight:  Shallow  Psychomotor Activity:  Normal  Concentration:  Concentration: Poor and Attention Span: Fair  Recall:  Poor  Fund of  Knowledge:  Poor  Language:  Normal speech rate and tone; however, frequetly makes random statements that don't make sense  Akathisia:  Negative  Handed:  Right  AIMS (if indicated):     Assets:  Communication Skills Housing Leisure Time Physical Health Resilience Social Support  ADL's:  Intact  Cognition:  WNL  Sleep:  Number of Hours: 7    Treatment Plan Summary: Daily contact with patient to assess and evaluate symptoms and progress in treatment  -  Continue inpatient hospitalization - Continue current medications - Continue reality and rehab based psychotherapy  Fonnie Mu, Medical Student 06/02/2019, 7:36 AM

## 2019-06-02 NOTE — [facility] Group Notes (Signed)
[facility] LCSW Group Therapy Note  Date/Time: 06/02/2019 @ 1:30pm  Type of Therapy and Topic:  Group Therapy:  Overcoming Obstacles  Participation Level:  [facility] PARTICIPATION LEVEL: Did Not Attend  Description of Group:    In this group patients will be encouraged to explore what they see as obstacles to their own wellness and recovery. They will be guided to discuss their thoughts, feelings, and behaviors related to these obstacles. The group will process together ways to cope with barriers, with attention given to specific choices patients can make. Each patient will be challenged to identify changes they are motivated to make in order to overcome their obstacles. This group will be process-oriented, with patients participating in exploration of their own experiences as well as giving and receiving support and challenge from other group members.  Therapeutic Goals: 1. Patient will identify personal and current obstacles as they relate to admission. 2. Patient will identify barriers that currently interfere with their wellness or overcoming obstacles.  3. Patient will identify feelings, thought process and behaviors related to these barriers. 4. Patient will identify two changes they are willing to make to overcome these obstacles:    Summary of Patient Progress   There was no group due to high acuity of the unit.    Therapeutic Modalities:   Cognitive Behavioral Therapy Solution Focused Therapy Motivational Interviewing Relapse Prevention Therapy   Jamarea Selner, LCSW 

## 2019-06-02 NOTE — Progress Notes (Signed)
Recreation Therapy Notes  Date: 2.11.21 Time: 0950 Location: 500 Hall Dayroom  Group Topic: Leisure Education  Goal Area(s) Addresses:  Patient will identify positive leisure activities.  Patient will identify one positive benefit of participation in leisure activities.   Behavioral Response: None  Intervention: Leisure Group Game  Activity: Keep It Contractor.  LRT and patients had to toss or bounce the ball to each other.  LRT kept time of how long the ball was in motion.  The ball was to stay moving at all times.  The ball could not come to a stop.  If the ball stopped, the time started over.  Education:  Leisure Education, Building control surveyor  Education Outcome: Acknowledges education/In group clarification offered/Needs additional education  Clinical Observations/Feedback: Pt kept asking for snacks.  When told the time snacks were given out, pt left and did not return.    Caroll Rancher, LRT/CTRS         Lillia Abed, Phylliss Strege A 06/02/2019 10:40 AM

## 2019-06-02 NOTE — Progress Notes (Signed)
   06/02/19 2053  Psych Admission Type (Psych Patients Only)  Admission Status Involuntary  Psychosocial Assessment  Patient Complaints Anxiety;Depression  Eye Contact Fair  Facial Expression Pained;Sad;Anxious  Affect Sad  Speech Logical/coherent;Slow  Interaction Assertive  Motor Activity Other (Comment) (WNL)  Appearance/Hygiene Unremarkable  Behavior Characteristics Cooperative  Mood Depressed;Anxious  Aggressive Behavior  Effect No apparent injury  Thought Process  Coherency Disorganized  Content Blaming self;Preoccupation  Delusions None reported or observed  Perception Hallucinations  Hallucination Auditory;Visual  Judgment Poor  Confusion None  Danger to Self  Current suicidal ideation? Passive  Description of Suicide Plan slit her wrists  Self-Injurious Behavior Some self-injurious ideation observed or expressed.  No lethal plan expressed   Agreement Not to Harm Self Yes  Description of Agreement verbal  Danger to Others  Danger to Others None reported or observed   Pt seen at nurse's station. Pt endorses SI saying that she would slit her wrists. When asked the reason, pt states "I don't know." Pt denies HI. Endorses AVH, saying, " I see ghosts I guess. The voices aren't telling me anything right now." Pt contracts for safety.

## 2019-06-03 MED ORDER — LORAZEPAM 2 MG/ML IJ SOLN
INTRAMUSCULAR | Status: AC
  Administered 2019-06-03: 2 mg via INTRAMUSCULAR
  Filled 2019-06-03: qty 1

## 2019-06-03 MED ORDER — LORAZEPAM 2 MG/ML IJ SOLN: 1.0000 mg | Freq: Once | INTRAMUSCULAR | Status: DC

## 2019-06-03 MED ORDER — ZIPRASIDONE MESYLATE 20 MG IM SOLR
INTRAMUSCULAR | Status: AC
  Administered 2019-06-03: 20 mg via INTRAMUSCULAR
  Filled 2019-06-03: qty 20

## 2019-06-03 MED ORDER — BUSPIRONE HCL 10 MG PO TABS
10.0000 mg | ORAL_TABLET | Freq: Two times a day (BID) | ORAL | Status: DC
  Administered 2019-06-03 – 2019-06-09 (×12): 10 mg via ORAL
  Filled 2019-06-03 (×7): qty 1
  Filled 2019-06-03: qty 2
  Filled 2019-06-03 (×4): qty 1
  Filled 2019-06-03: qty 2
  Filled 2019-06-03 (×3): qty 1

## 2019-06-03 MED ORDER — SERTRALINE HCL 20 MG/ML PO CONC
50.0000 mg | Freq: Every day | ORAL | Status: DC
  Administered 2019-06-04 – 2019-06-07 (×4): 50 mg via ORAL
  Filled 2019-06-03 (×6): qty 2.5

## 2019-06-03 MED ORDER — LORAZEPAM 2 MG/ML IJ SOLN: 2.0000 mg | Freq: Once | INTRAMUSCULAR | Status: AC

## 2019-06-03 MED ORDER — ZIPRASIDONE MESYLATE 20 MG IM SOLR
20.0000 mg | Freq: Once | INTRAMUSCULAR | Status: AC
  Filled 2019-06-03: qty 20

## 2019-06-03 MED ORDER — RISPERIDONE 3 MG PO TABS
6.0000 mg | ORAL_TABLET | Freq: Every day | ORAL | Status: DC
  Filled 2019-06-03 (×2): qty 2

## 2019-06-03 MED ORDER — RISPERIDONE 2 MG PO TABS
2.0000 mg | ORAL_TABLET | Freq: Every day | ORAL | Status: DC
  Administered 2019-06-04: 08:00:00 2 mg via ORAL
  Filled 2019-06-03 (×2): qty 1

## 2019-06-03 NOTE — Progress Notes (Signed)
Pt wants to know why she is not on the depression medications that she was prescribed at [facility]. Pt says "I was on an antidepressant that starts with a "Z" and it was helping but not on it now." Pt stated that meds here weren't working and pt educated on the time it takes for medication to reach a certain level in her system to be effective. Pt stated that she feels different and wants back on her Zoloft and Buspar. When asked about not taking them before her admission here, pt said, "My aunt wouldn't take me to go fill my prescriptions when I left [facility]. I don't know why." Told pt I would leave a note for provider stating her wishes.

## 2019-06-03 NOTE — [facility] Counselor (Signed)
CSW attempted to meet with patient twice today. Patient unable to complete assessment due to altered mental status. This is the 2nd attempt for patient.
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
## 2019-06-03 NOTE — Progress Notes (Signed)
  Pt is caucasian female  of 24 y.o. , DOB [birth year], MRN  [id number]  presents IVC with a SI without method,  Hx of suicide attempts.    Patient reports good appetite, energy normal, and fair sleeping pattern.  Pt rates  depression 9 out of 10, hopelessness 8 out of 10, and anxiety at 9 out of 10. Pt endorses SI without  plans. Denies  HI or AVH.  Pt reports LBM yesterday. No pain reported today. Vitals signs WNL and being monitored.  Medication given as Rx, no adverse reaction observed, safety maintained with q15 minute checks.    Einar Crow. Melvyn Neth MSN, RN, [facility] [phone]

## 2019-06-03 NOTE — Progress Notes (Signed)
Recreation Therapy Notes  Date: 2.12.21 Time: 1000 Location:  500 Hall Dayroom  Group Topic: Communication, Team Building, Problem Solving  Goal Area(s) Addresses:  Patient will effectively work with peer towards shared goal.  Patient will identify skills used to make activity successful.  Patient will identify how skills used during activity can be used to reach post d/c goals.   Behavioral Response: Disruptive  Intervention: STEM Activity  Activity:  Straw Bridge.  In groups, patients were given 15 plastic straws and a 83ft piece of masking tape.  Patients were to build a free standing bridge using all the straws and tape given.  Patients could bend straws as needed to construct bridge.  The bridge constructed had to be able to hold a small puzzle box.    Education: Pharmacist, community, Discharge Planning   Education Outcome: Acknowledges education/In group clarification offered/Needs additional education.   Clinical Observations/Feedback: Pt was unable to focus on activity.  Pt couldn't sit still.  Pt kept getting up walking around in circles.  Pt left and returned at the end of group.    Caroll Rancher, LRT/CTRS         Caroll Rancher A 06/03/2019 11:27 AM

## 2019-06-03 NOTE — Progress Notes (Signed)
Spirituality group facilitated by Wilkie Aye, MDiv, [facility].  Group focused on the topic of "hope."   Patients were led in a facilitated discussion, creating a word cloud to respond to the prompt "Hope is."   Patients named experiences both past and present that inform their understanding of hope  Patients engaged in a visual explorer activity - choosing a photo that represented hope for today.     Julie Gentry was present through group.  She was minimally able to orient to topic, presenting as tangential.  Paced back and forth in group room and left room several times.  She was concerned for another group member who was working with staff in the hall.  [name] was able to respond that she sees hope in [place], trust, and medication.
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
## 2019-06-03 NOTE — Progress Notes (Signed)
[place] NOVEL CORONAVIRUS (COVID-19) DAILY CHECK-OFF SYMPTOMS - answer yes or no to each - every day NO YES  Have you had a fever in the past 24 hours?  . Fever (Temp > 37.80C / 100F) X   Have you had any of these symptoms in the past 24 hours? . New Cough .  Sore Throat  .  Shortness of Breath .  Difficulty Breathing .  Unexplained Body Aches   X   Have you had any one of these symptoms in the past 24 hours not related to allergies?   . Runny Nose .  Nasal Congestion .  Sneezing   X   If you have had runny nose, nasal congestion, sneezing in the past 24 hours, has it worsened?  X   EXPOSURES - check yes or no X   Have you traveled outside the state in the past 14 days?  X   Have you been in contact with someone with a confirmed diagnosis of COVID-19 or PUI in the past 14 days without wearing appropriate PPE?  X   Have you been living in the same home as a person with confirmed diagnosis of COVID-19 or a PUI (household contact)?    X   Have you been diagnosed with COVID-19?    X              What to do next: Answered NO to all: Answered YES to anything:   Proceed with unit schedule Follow the [facility] Inpatient Flowsheet.   Julie Gentry K. Keirra Zeimet MSN, RN, [facility] [phone] 

## 2019-06-04 MED ORDER — ZIPRASIDONE MESYLATE 20 MG IM SOLR
20.0000 mg | INTRAMUSCULAR | Status: AC | PRN
  Administered 2019-06-05: 20 mg via INTRAMUSCULAR
  Filled 2019-06-04: qty 20

## 2019-06-04 MED ORDER — OLANZAPINE 10 MG PO TBDP
10.0000 mg | ORAL_TABLET | Freq: Three times a day (TID) | ORAL | Status: DC | PRN
  Administered 2019-06-05 – 2019-06-14 (×15): 10 mg via ORAL
  Filled 2019-06-04 (×15): qty 1

## 2019-06-04 MED ORDER — OLANZAPINE 5 MG PO TBDP
5.0000 mg | ORAL_TABLET | Freq: Every day | ORAL | Status: DC
  Administered 2019-06-04 – 2019-06-05 (×2): 5 mg via ORAL
  Filled 2019-06-04 (×3): qty 1

## 2019-06-04 MED ORDER — TEMAZEPAM 7.5 MG PO CAPS
7.5000 mg | ORAL_CAPSULE | Freq: Every evening | ORAL | Status: DC | PRN
  Administered 2019-06-04: 7.5 mg via ORAL
  Filled 2019-06-04 (×2): qty 1

## 2019-06-04 MED ORDER — OLANZAPINE 10 MG PO TBDP
10.0000 mg | ORAL_TABLET | Freq: Every day | ORAL | Status: DC
  Administered 2019-06-04: 10 mg via ORAL
  Filled 2019-06-04 (×4): qty 1

## 2019-06-04 MED ORDER — LORAZEPAM 1 MG PO TABS
1.0000 mg | ORAL_TABLET | Freq: Four times a day (QID) | ORAL | Status: DC | PRN
  Administered 2019-06-05 – 2019-06-09 (×7): 1 mg via ORAL
  Filled 2019-06-04 (×8): qty 1

## 2019-06-04 MED ORDER — LORAZEPAM 1 MG PO TABS
1.0000 mg | ORAL_TABLET | ORAL | Status: AC | PRN
  Administered 2019-06-04: 09:00:00 1 mg via ORAL
  Filled 2019-06-04: qty 1

## 2019-06-04 NOTE — Progress Notes (Addendum)
Pt states that she doesn't have a list of people that she wants to talk to. She wants to add her father's ex-wife, Rowan Blase (or Kinnear, depending on what last name she uses) to the list of people she will allow to call and talk to her. Pt does not know her phone number.
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
## 2019-06-04 NOTE — [facility] Group Notes (Signed)
Adult Psychoeducational Group Note  Date:  06/04/2019 Time:  12:15 PM  Group Topic/Focus:  Goals Group:   The focus of this group is to help patients establish daily goals to achieve during treatment and discuss how the patient can incorporate goal setting into their daily lives to aide in recovery.  Participation Level:  Minimal  Participation Quality:  Intrusive  Affect:  Irritable  Cognitive:  Disorganized  Insight: Limited  Engagement in Group:  Limited  Modes of Intervention:  Discussion  Additional Comments:  Pt kept getting up during the group and walking in and out several times. . Did say at one point "I think I am crazy". States her energy is low. Conversation was disjointed.  Dione Housekeeper 06/04/2019, 12:15 PM

## 2019-06-04 NOTE — Progress Notes (Signed)
   06/04/19 1300  Psych Admission Type (Psych Patients Only)  Admission Status Involuntary  Psychosocial Assessment  Patient Complaints Anxiety;Depression  Eye Contact Fair  Facial Expression Flat  Affect Sad  Speech Slow  Interaction Assertive  Motor Activity Slow  Appearance/Hygiene Unremarkable  Behavior Characteristics Cooperative  Mood Depressed;Anxious  Aggressive Behavior  Effect No apparent injury  Thought Process  Coherency Disorganized  Content WDL  Delusions None reported or observed  Perception WDL  Hallucination None reported or observed  Judgment Poor  Confusion None  Danger to Self  Current suicidal ideation? Denies  Self-Injurious Behavior Some self-injurious ideation observed or expressed.  No lethal plan expressed   Agreement Not to Harm Self Yes  Description of Agreement verbal  Danger to Others  Danger to Others None reported or observed

## 2019-06-04 NOTE — Progress Notes (Signed)
Pt is at nurse's station crying. She is concerned about her actions yesterday and why she wakes up anxious. "I want to know if someone is keeping up with everything - what I do wrong and what happens to me. I just want to know what is wrong with me. I think I'm gonna cry." Patient assured that notes are made about incidents that happen involving her and her care.

## 2019-06-04 NOTE — Progress Notes (Signed)
Pt manic this morning and attention-seeking. Pt going to other pts rooms waking them up for vital sign checks. Pt saying random things at the nurse's station. Pt says she is going to hang herself and walks off. Then pt comes back to desk and ask for a crossword puzzle to quiet her mind because she wants to go to breakfast now. Pt hangs around with pt from 504[address]01 and they seem to be feeding off each other's psychosis. This may be agitating pt.
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
## 2019-06-04 NOTE — [facility] Group Notes (Signed)
  [facility] LCSW Group Therapy Note  Date/Time:  06/04/2019 11:30AM-12:00PM  Type of Therapy and Topic:  Group Therapy:  Feelings About Hospitalization  Participation Level:  Active   Description of Group This process group involved patients discussing their feelings related to being hospitalized, as well as the benefits they see to being in the hospital.  These feelings and benefits were itemized.  The group then brainstormed specific ways in which they could seek those same benefits when they discharge and return home.  Therapeutic Goals Patient will identify and describe positive and negative feelings related to hospitalization Patient will verbalize benefits of hospitalization to themselves personally Patients will brainstorm together ways they can obtain similar benefits in the outpatient setting, identify barriers to wellness and possible solutions  Summary of Patient Progress:  The patient expressed her primary feelings about being hospitalized are not good, because patients should go outside and people should listen.  On the other hand, she said she is "okay with it" but wishes she could see her friends.  She would not tell CSW her name, said "I don't have a name, but all these addictions, I've given them names."  She said if she was not in the hospital today, she does not know if she would be crying.  She hopes she would "keep fighting."  She stated that coming off some depression medication makes her feel crazy.  She reported being at [facility] about 2 weeks prior to coming here only 1/2 day after discharging from there.  She further stated that she "thought somebody stole my mind."  Therapeutic Modalities Cognitive Behavioral Therapy Motivational Interviewing    Ambrose Mantle, LCSW 06/04/2019, 12:12 PM

## 2019-06-04 NOTE — Progress Notes (Signed)
Adult Psychoeducational Group Note  Date:  06/04/2019 Time:  11:00 PM  Group Topic/Focus:  Wrap-Up Group:   The focus of this group is to help patients review their daily goal of treatment and discuss progress on daily workbooks.  Participation Level:  Minimal  Participation Quality:  Appropriate  Affect:  Appropriate  Cognitive:  Lacking  Insight: Lacking  Engagement in Group:  Engaged  Modes of Intervention:  Education, Socialization and Support  Additional Comments:  Patient attended and participated in group. She reported that the day was fine. She went to the [facility].  Lita Mains [facility] 06/04/2019, 11:00 PM

## 2019-06-04 NOTE — Progress Notes (Signed)
EKG results placed on the outside of shadow chart  Sinus tachycardia 110 bpm  QT/QTc 354/479 ms
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
## 2019-06-05 LAB — COMPREHENSIVE METABOLIC PANEL
ALT: 74 U/L — ABNORMAL HIGH (ref 0–44)
AST: 32 U/L (ref 15–41)
Albumin: 4.4 g/dL (ref 3.5–5.0)
Alkaline Phosphatase: 66 U/L (ref 38–126)
Anion gap: 8 (ref 5–15)
BUN: 22 mg/dL — ABNORMAL HIGH (ref 6–20)
CO2: 22 mmol/L (ref 22–32)
Calcium: 9.1 mg/dL (ref 8.9–10.3)
Chloride: 109 mmol/L (ref 98–111)
Creatinine, Ser: 0.43 mg/dL — ABNORMAL LOW (ref 0.44–1.00)
GFR calc Af Amer: >60 mL/min (ref >60)
GFR calc non Af Amer: >60 mL/min (ref >60)
Glucose, Bld: 110 mg/dL — ABNORMAL HIGH (ref 70–99)
Potassium: 3.9 mmol/L (ref 3.5–5.1)
Sodium: 139 mmol/L (ref 135–145)
Total Bilirubin: 0.4 mg/dL (ref 0.3–1.2)
Total Protein: 7.4 g/dL (ref 6.5–8.1)

## 2019-06-05 MED ORDER — OLANZAPINE 5 MG PO TBDP: 5.0000 mg | ORAL_TABLET | Freq: Two times a day (BID) | ORAL | Status: DC | PRN

## 2019-06-05 MED ORDER — OLANZAPINE 5 MG PO TBDP
15.0000 mg | ORAL_TABLET | Freq: Every day | ORAL | Status: DC
  Filled 2019-06-05 (×3): qty 1

## 2019-06-05 NOTE — [facility] Group Notes (Signed)
Adult Psychoeducational Group Note  Date:  06/05/2019 Time:  12:31 PM  Group Topic/Focus:  Progressive Relaxation; Progressive muscle relaxation, How to deep breathe and Visible Imagery.  Participation Level:  Did Not Attend   Dione Housekeeper 06/05/2019, 12:31 PM

## 2019-06-05 NOTE — Progress Notes (Signed)
[facility] MD Progress Note  06/05/2019 12:04 PM Merlin Ege  MRN:  [id number] Subjective: Patient is a 24 year old female with a past psychiatric history significant for psychosis.  She apparently was released from [facility] and then brought directly to [facility] secondary to concern about continued psychotic symptoms.  She was admitted to our facility on 06/01/2019 secondary to behavior that appeared to be bizarre, nonsensical statements, suicidal thoughts, and substance abuse.   Objective: Patient is seen and examined.  Patient is a 24 year old female with the above-stated past psychiatric history who is seen in follow-up this a.m.  She is significantly sedated.  Review of the nursing notes reflect she ended up getting as needed's yesterday for agitation and psychosis.  She also had not been sleeping well.  I had stopped the Risperdal yesterday and she received her Zyprexa instead.  I think that is probably why she is so sedated today.  She is arousable, but sedated.  Her EKG from yesterday showed a sinus tachycardia with a mildly elevated QTC at 479.  Nursing notes show she slept 5.5 hours last night.  Principal Problem: <principal problem not specified> Diagnosis: Active Problems:   Brief psychotic disorder (HCC)   Schizophreniform disorder (HCC)  Total Time spent with patient: 15 minutes  Past Psychiatric History: See admission H&P  Past Medical History: History reviewed. No pertinent past medical history. History reviewed. No pertinent surgical history. Family History: History reviewed. No pertinent family history. Family Psychiatric  History: See admission H&P Social History:  Social History   Substance and Sexual Activity  Alcohol Use Not Currently     Social History   Substance and Sexual Activity  Drug Use Yes  . Types: Marijuana   Comment: occasionally    Social History   Socioeconomic History  . Marital status: Single    Spouse name: Not  on file  . Number of children: Not on file  . Years of education: 77  . Highest education level: High school graduate  Occupational History  . Occupation: Unemployed  Tobacco Use  . Smoking status: Current Some Day Smoker    Packs/day: 1.00    Types: Cigarettes  . Smokeless tobacco: Never Used  Substance and Sexual Activity  . Alcohol use: Not Currently  . Drug use: Yes    Types: Marijuana    Comment: occasionally  . Sexual activity: Yes    Birth control/protection: None  Other Topics Concern  . Not on file  Social History Narrative   Pt is unemployed; lives in [place] with her father   Social Determinants of Health   Financial Resource Strain:   . Difficulty of Paying Living Expenses: Not on file  Food Insecurity:   . Worried About Programme researcher, broadcasting/film/video in the Last Year: Not on file  . Ran Out of Food in the Last Year: Not on file  Transportation Needs:   . Lack of Transportation (Medical): Not on file  . Lack of Transportation (Non-Medical): Not on file  Physical Activity:   . Days of Exercise per Week: Not on file  . Minutes of Exercise per Session: Not on file  Stress:   . Feeling of Stress : Not on file  Social Connections:   . Frequency of Communication with Friends and Family: Not on file  . Frequency of Social Gatherings with Friends and Family: Not on file  . Attends Religious Services: Not on file  . Active Member of Clubs or Organizations: Not on file  .  Attends Banker Meetings: Not on file  . Marital Status: Not on file   Additional Social History:    Pain Medications: See MAR Prescriptions: See MAR Over the Counter: See MAR History of alcohol / drug use?: Yes Longest period of sobriety (when/how long): UTA Negative Consequences of Use: Work / School Name of Substance 1: THC 1 - Frequency: UTA Name of Substance 2: Amphetamines 2 - Frequency: UTA Name of Substance 3: benzos 3 - Frequency: UTA 3 - Last Use / Amount: UTA               Sleep: Good  Appetite:  Fair  Current Medications: Current Facility-Administered Medications  Medication Dose Route Frequency Provider Last Rate Last Admin  . acetaminophen (TYLENOL) tablet 650 mg  650 mg Oral Q6H PRN Anike, Adaku C, NP   650 mg at 06/02/19 2107  . benztropine (COGENTIN) tablet 0.5 mg  0.5 mg Oral BID Malvin Johns, MD   0.5 mg at 06/05/19 4650  . busPIRone (BUSPAR) tablet 10 mg  10 mg Oral BID Malvin Johns, MD   10 mg at 06/05/19 0820  . feeding supplement (ENSURE ENLIVE) (ENSURE ENLIVE) liquid 237 mL  237 mL Oral BID BM Malvin Johns, MD   237 mL at 06/05/19 0824  . hydrOXYzine (ATARAX/VISTARIL) tablet 25 mg  25 mg Oral TID PRN Anike, Adaku C, NP   25 mg at 06/05/19 0528  . LORazepam (ATIVAN) tablet 1 mg  1 mg Oral Q6H PRN Antonieta Pert, MD      . nicotine (NICODERM CQ - dosed in mg/24 hours) patch 21 mg  21 mg Transdermal Daily Malvin Johns, MD   21 mg at 06/05/19 3546  . OLANZapine zydis (ZYPREXA) disintegrating tablet 10 mg  10 mg Oral Q8H PRN Antonieta Pert, MD   10 mg at 06/05/19 0604  . OLANZapine zydis (ZYPREXA) disintegrating tablet 10 mg  10 mg Oral QHS Antonieta Pert, MD   10 mg at 06/04/19 2108  . omega-3 acid ethyl esters (LOVAZA) capsule 1 g  1 g Oral BID Malvin Johns, MD   1 g at 06/05/19 0820  . pneumococcal 23 valent vaccine (PNEUMOVAX-23) injection 0.5 mL  0.5 mL Intramuscular Tomorrow-1000 Malvin Johns, MD      . prenatal multivitamin tablet 1 tablet  1 tablet Oral Daily Malvin Johns, MD   1 tablet at 06/05/19 0820  . sertraline (ZOLOFT) 20 MG/ML concentrated solution 50 mg  50 mg Oral Daily Malvin Johns, MD   50 mg at 06/05/19 5681  . traZODone (DESYREL) tablet 50 mg  50 mg Oral QHS Anike, Adaku C, NP   50 mg at 06/04/19 2108    Lab Results: No results found for this or any previous visit (from the past 48 hour(s)).  Blood Alcohol level:  Lab Results  Component Value Date   ETH <10 03/21/2018    Metabolic Disorder Labs: Lab Results   Component Value Date   HGBA1C 5.1 06/01/2019   MPG 100 06/01/2019   Lab Results  Component Value Date   PROLACTIN 12.7 06/01/2019   Lab Results  Component Value Date   CHOL 178 06/01/2019   TRIG 59 06/01/2019   HDL 54 06/01/2019   CHOLHDL 3.3 06/01/2019   VLDL 12 06/01/2019   LDLCALC 112 (H) 06/01/2019    Physical Findings: AIMS: Facial and Oral Movements Muscles of Facial Expression: None, normal Lips and Perioral Area: None, normal Jaw: None, normal Tongue: None, normal,Extremity  Movements Upper (arms, wrists, hands, fingers): None, normal Lower (legs, knees, ankles, toes): None, normal, Trunk Movements Neck, shoulders, hips: None, normal, Overall Severity Severity of abnormal movements (highest score from questions above): None, normal Incapacitation due to abnormal movements: None, normal Patient's awareness of abnormal movements (rate only patient's report): No Awareness, Dental Status Current problems with teeth and/or dentures?: No Does patient usually wear dentures?: No  CIWA:    COWS:     Musculoskeletal: Strength & Muscle Tone: within normal limits Gait & Station: shuffle Patient leans: N/A  Psychiatric Specialty Exam: Physical Exam  Nursing note and vitals reviewed. Constitutional: She is oriented to person, place, and time. She appears well-developed and well-nourished.  HENT:  Head: Normocephalic and atraumatic.  Respiratory: Effort normal.  Neurological: She is alert and oriented to person, place, and time.    Review of Systems  Blood pressure 121/88, pulse (!) 132, temperature (!) 97.3 F (36.3 C), temperature source Oral, resp. rate 18, height 5\' 2"  (1.575 m), weight 47.5 kg.Body mass index is 19.15 kg/m.  General Appearance: Disheveled  Eye Contact:  Minimal  Speech:  Slow  Volume:  Decreased  Mood:  Sedated  Affect:  Blunt  Thought Process:  Goal Directed and Descriptions of Associations: Circumstantial  Orientation:  Full (Time, Place,  and Person)  Thought Content:  Negative  Suicidal Thoughts:  No  Homicidal Thoughts:  No  Memory:  Immediate;   Poor Recent;   Poor Remote;   Poor  Judgement:  Impaired  Insight:  Lacking  Psychomotor Activity:  Decreased  Concentration:  Concentration: Fair and Attention Span: Fair  Recall:  of Knowledge:  Fair  Language:  Fair  Akathisia:  Negative  Handed:  Right  AIMS (if indicated):     Assets:  Desire for Improvement Resilience  ADL's:  Intact  Cognition:  WNL  Sleep:  Number of Hours: 5.5     Treatment Plan Summary: Daily contact with patient to assess and evaluate symptoms and progress in treatment, Medication management and Plan : Patient is seen and examined.  Patient is a 24 year old female with the above-stated past psychiatric history who is seen in follow-up.   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  Patient is seen in follow-up.  She is more sedated today with the change in medications.  She is much less agitated.  Because of the sedation during the day I am going to consolidate the Zyprexa to 15 mg p.o. nightly.  I will hold the daytime dose tomorrow.  We will continue the Zydis 10 mg every 8 hours as needed if her agitation increases.  No change in the lorazepam for alcohol withdrawal symptoms.  I will order laboratories for this afternoon just to assess her hydration status.  I will stop the Cogentin secondary to sedation and the fact that she has been switched to the olanzapine.  1.  Stop Cogentin. 2.  Continue BuSpar 10 mg p.o. twice daily for anxiety. 3.  Continue hydroxyzine 25 mg p.o. 3 times daily as needed anxiety. 4.  Continue agitation protocol including Zyprexa, lorazepam and Geodon  IM. 5.  Continue prenatal vitamin. 6.  Change Zyprexa to 15 mg p.o. nightly only and titrate secondary to psychosis. 7.  Continue Zoloft 50 mg p.o. daily for depression and anxiety, but  if she appears to still be manic suggesting the possibility of bipolar disorder I may end up stopping it. 8.  Continue temazepam 15 mg p.o. nightly as needed insomnia. 9.  Continue trazodone 50 mg p.o. nightly for insomnia. 10.  Metabolic panel this p.m. 11.  Disposition planning-in progress.  Sharma Covert, MD 06/05/2019, 12:04 PM

## 2019-06-05 NOTE — Progress Notes (Signed)
   06/05/19 1000  Psych Admission Type (Psych Patients Only)  Admission Status Involuntary  Psychosocial Assessment  Patient Complaints Anxiety  Eye Contact Fair  Facial Expression Flat  Affect Anxious;Preoccupied  Speech Tangential;Pressured  Interaction Assertive  Motor Activity Restless  Appearance/Hygiene Unremarkable  Behavior Characteristics Cooperative;Anxious  Mood Anxious;Sad  Aggressive Behavior  Effect No apparent injury  Thought Process  Coherency Disorganized  Content WDL  Delusions None reported or observed  Perception WDL  Hallucination None reported or observed  Judgment Poor  Confusion None  Danger to Self  Current suicidal ideation? Denies  Self-Injurious Behavior Some self-injurious ideation observed or expressed.  No lethal plan expressed   Agreement Not to Harm Self Yes  Description of Agreement verbal  Danger to Others  Danger to Others None reported or observed

## 2019-06-05 NOTE — Progress Notes (Signed)
Patient did not attend group because she was asleep.  

## 2019-06-05 NOTE — [facility] Group Notes (Signed)
Adult Psychoeducational Group Note  Date:  06/05/2019 Time:  12:37 PM  Group Topic/Focus:  Progressive Relaxation; Progressive muscle relaxation, How to deep breathe and Visible Imagery.  Participation Level:  Did Not Attend   Dione Housekeeper 06/05/2019, 12:37 PM

## 2019-06-05 NOTE — [facility] Group Notes (Signed)
n

## 2019-06-06 MED ORDER — RISPERIDONE 3 MG PO TABS
3.0000 mg | ORAL_TABLET | Freq: Every day | ORAL | Status: DC
  Administered 2019-06-07 – 2019-06-09 (×3): 3 mg via ORAL
  Filled 2019-06-06: qty 3
  Filled 2019-06-06 (×5): qty 1

## 2019-06-06 NOTE — Progress Notes (Signed)
   06/05/19 2330  COVID-19 Daily Checkoff  Have you had a fever (temp > 37.80C/100F)  in the past 24 hours?  No  If you have had runny nose, nasal congestion, sneezing in the past 24 hours, has it worsened? No  COVID-19 EXPOSURE  Have you traveled outside the state in the past 14 days? No  Have you been in contact with someone with a confirmed diagnosis of COVID-19 or PUI in the past 14 days without wearing appropriate PPE? No  Have you been living in the same home as a person with confirmed diagnosis of COVID-19 or a PUI (household contact)? No  Have you been diagnosed with COVID-19? No

## 2019-06-06 NOTE — Progress Notes (Signed)
Recreation Therapy Notes  Date: 2.15.21 Time: 1000 Location: 500 Hall Dayroom  Group Topic: Coping Skills  Goal Area(s) Addresses:  Patient will identify positive coping skills. Patient will identify benefit of using positive coping skills.  Behavioral Response:  None  Intervention: Magazines, Holiday representative paper, scissors, glue sticks  Activity: Coping Collage.  Patients were to use magazine to identify pictures of coping skills that could be used for diversions, social, cognitive, tension releasers and physical.  Patients were to find at least two positive coping skills for each.   Education: Pharmacologist, Building control surveyor.   Education Outcome: Acknowledges understanding/In group clarification offered/Needs additional education.   Clinical Observations/Feedback: Pt came in a few times but was unable to sit still and focus.  Pt left and did not return.      Caroll Rancher, LRT/CTRS         Caroll Rancher A 06/06/2019 11:52 AM

## 2019-06-06 NOTE — [facility] Group Notes (Signed)
[facility] LCSW Group Therapy Note  Date/Time: 06/06/2019 @ 1:30pm  Type of Therapy and Topic:  Group Therapy:  Overcoming Obstacles  Participation Level:  [facility] PARTICIPATION LEVEL: Active  Description of Group:    In this group patients will be encouraged to explore what they see as obstacles to their own wellness and recovery. They will be guided to discuss their thoughts, feelings, and behaviors related to these obstacles. The group will process together ways to cope with barriers, with attention given to specific choices patients can make. Each patient will be challenged to identify changes they are motivated to make in order to overcome their obstacles. This group will be process-oriented, with patients participating in exploration of their own experiences as well as giving and receiving support and challenge from other group members.  Therapeutic Goals: 1. Patient will identify personal and current obstacles as they relate to admission. 2. Patient will identify barriers that currently interfere with their wellness or overcoming obstacles.  3. Patient will identify feelings, thought process and behaviors related to these barriers. 4. Patient will identify two changes they are willing to make to overcome these obstacles:    Summary of Patient Progress   Patient was active and engaged in group therapy today. Patient was able to identify a personal and current obstacle that she has which is to get out of the hospital. Patient was able to identify barriers that currently interfere with the overcoming her obstacles which is the doctor and her having mixed personalities. Patient was able to identify two changes that she is willing to make to overcome her obstacles which is take the new medication for her depression, when she discharges go get the medication that she needs/get it filled, and try to go to rehab ([facility] or [facility]).    Therapeutic Modalities:   Cognitive Behavioral Therapy Solution Focused  Therapy Motivational Interviewing Relapse Prevention Therapy   Stephannie Peters, LCSW

## 2019-06-06 NOTE — Progress Notes (Signed)
DAR NOTE: Patient presents with anxious affect and mood. Denies suicidal thought, pain, auditory and visual hallucinations.  Observed pacing the hallway preoccupied.  Maintained on routine safety checks.  Medications given as prescribed.  Support and encouragement offered as needed.  Attended group and participated.  Patient continues to write on the wall in her room after several redirection.  Requested and received Ativan 1 mg and Zyprexa 10 mg for complain of agitation with fair effect.  Patient is safe on the unit.
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
## 2019-06-06 NOTE — Progress Notes (Signed)
Patient has been asleep since shift change, No distress noted and safety maintained on unit with 15 min checks.

## 2019-06-06 NOTE — Tx Team (Signed)
Interdisciplinary Treatment and Diagnostic Plan Update  06/06/2019 Time of Session: 11:18am Julie Gentry MRN: [id number]  Principal Diagnosis: <principal problem not specified>  Secondary Diagnoses: Active Problems:   Brief psychotic disorder (HCC)   Schizophreniform disorder (HCC)   Current Medications:  Current Facility-Administered Medications  Medication Dose Route Frequency Provider Last Rate Last Admin  . acetaminophen (TYLENOL) tablet 650 mg  650 mg Oral Q6H PRN Anike, Adaku C, NP   650 mg at 06/02/19 2107  . busPIRone (BUSPAR) tablet 10 mg  10 mg Oral BID Malvin Johns, MD   10 mg at 06/06/19 7062  . feeding supplement (ENSURE ENLIVE) (ENSURE ENLIVE) liquid 237 mL  237 mL Oral BID BM Malvin Johns, MD   237 mL at 06/06/19 1410  . hydrOXYzine (ATARAX/VISTARIL) tablet 25 mg  25 mg Oral TID PRN Anike, Adaku C, NP   25 mg at 06/06/19 1408  . LORazepam (ATIVAN) tablet 1 mg  1 mg Oral Q6H PRN Antonieta Pert, MD   1 mg at 06/06/19 3762  . nicotine (NICODERM CQ - dosed in mg/24 hours) patch 21 mg  21 mg Transdermal Daily Malvin Johns, MD   21 mg at 06/06/19 0824  . OLANZapine zydis (ZYPREXA) disintegrating tablet 10 mg  10 mg Oral Q8H PRN Antonieta Pert, MD   10 mg at 06/06/19 1408  . omega-3 acid ethyl esters (LOVAZA) capsule 1 g  1 g Oral BID Malvin Johns, MD   1 g at 06/06/19 8315  . pneumococcal 23 valent vaccine (PNEUMOVAX-23) injection 0.5 mL  0.5 mL Intramuscular Tomorrow-1000 Malvin Johns, MD      . prenatal multivitamin tablet 1 tablet  1 tablet Oral Daily Malvin Johns, MD   1 tablet at 06/06/19 [phone]  . risperiDONE (RISPERDAL) tablet 3 mg  3 mg Oral QHS Malvin Johns, MD      . sertraline (ZOLOFT) 20 MG/ML concentrated solution 50 mg  50 mg Oral Daily Malvin Johns, MD   50 mg at 06/06/19 6073  . traZODone (DESYREL) tablet 50 mg  50 mg Oral QHS Anike, Adaku C, NP   50 mg at 06/06/19 0129   PTA Medications: Medications Prior to Admission  Medication Sig  Dispense Refill Last Dose  . ARIPiprazole (ABILIFY) 5 MG tablet Take 1 tablet by mouth daily.     . busPIRone (BUSPAR) 10 MG tablet Take 1 tablet by mouth 2 (two) times daily.     . hydrOXYzine (ATARAX/VISTARIL) 50 MG tablet Take 1 tablet by mouth 3 (three) times daily.     . sertraline (ZOLOFT) 100 MG tablet Take 1 tablet by mouth daily.       Patient Stressors: Legal issue Medication change or noncompliance  Patient Strengths: Average or above average intelligence Physical Health Supportive family/friends  Treatment Modalities: Medication Management, Group therapy, Case management,  1 to 1 session with clinician, Psychoeducation, Recreational therapy.   Physician Treatment Plan for Primary Diagnosis: <principal problem not specified> Long Term Goal(s): Improvement in symptoms so as ready for discharge Improvement in symptoms so as ready for discharge   Short Term Goals: Ability to identify and develop effective coping behaviors will improve Ability to maintain clinical measurements within normal limits will improve Compliance with prescribed medications will improve Ability to identify and develop effective coping behaviors will improve Ability to maintain clinical measurements within normal limits will improve Compliance with prescribed medications will improve  Medication Management: Evaluate patient's response, side effects, and tolerance of medication regimen.  Therapeutic  Interventions: 1 to 1 sessions, Unit Group sessions and Medication administration.  Evaluation of Outcomes: Progressing  Physician Treatment Plan for Secondary Diagnosis: Active Problems:   Brief psychotic disorder ([place])   Schizophreniform disorder ([place])  Long Term Goal(s): Improvement in symptoms so as ready for discharge Improvement in symptoms so as ready for discharge   Short Term Goals: Ability to identify and develop effective coping behaviors will improve Ability to maintain clinical  measurements within normal limits will improve Compliance with prescribed medications will improve Ability to identify and develop effective coping behaviors will improve Ability to maintain clinical measurements within normal limits will improve Compliance with prescribed medications will improve     Medication Management: Evaluate patient's response, side effects, and tolerance of medication regimen.  Therapeutic Interventions: 1 to 1 sessions, Unit Group sessions and Medication administration.  Evaluation of Outcomes: Progressing   RN Treatment Plan for Primary Diagnosis: <principal problem not specified> Long Term Goal(s): Knowledge of disease and therapeutic regimen to maintain health will improve  Short Term Goals: Ability to participate in decision making will improve, Ability to verbalize feelings will improve, Ability to disclose and discuss suicidal ideas, Ability to identify and develop effective coping behaviors will improve and Compliance with prescribed medications will improve  Medication Management: RN will administer medications as ordered by provider, will assess and evaluate patient's response and provide education to patient for prescribed medication. RN will report any adverse and/or side effects to prescribing provider.  Therapeutic Interventions: 1 on 1 counseling sessions, Psychoeducation, Medication administration, Evaluate responses to treatment, Monitor vital signs and CBGs as ordered, Perform/monitor CIWA, COWS, AIMS and Fall Risk screenings as ordered, Perform wound care treatments as ordered.  Evaluation of Outcomes: Progressing   LCSW Treatment Plan for Primary Diagnosis: <principal problem not specified> Long Term Goal(s): Safe transition to appropriate next level of care at discharge, Engage patient in therapeutic group addressing interpersonal concerns.  Short Term Goals: Engage patient in aftercare planning with referrals and resources and Increase  skills for wellness and recovery  Therapeutic Interventions: Assess for all discharge needs, 1 to 1 time with Social worker, Explore available resources and support systems, Assess for adequacy in community support network, Educate family and significant other(s) on suicide prevention, Complete Psychosocial Assessment, Interpersonal group therapy.  Evaluation of Outcomes: Progressing   Progress in Treatment: Attending groups: No. Participating in groups: No. Taking medication as prescribed: Yes. Toleration medication: Yes. Family/Significant other contact made: No, will contact:  pt's father Patient understands diagnosis: No. Discussing patient identified problems/goals with staff: Yes. Medical problems stabilized or resolved: Yes. Denies suicidal/homicidal ideation: Yes. Issues/concerns per patient self-inventory: No. Other:   New problem(s) identified: No, Describe:  None  New Short Term/Long Term Goal(s): Medication stabilization, elimination of SI thoughts, and development of a comprehensive mental wellness plan.   Patient Goals:  "to get better:   Discharge Plan or Barriers: Patient is wanting to go to rehab at either [facility] or [facility] or follow up outpatient with substance abuse treatment and therapy.   Reason for Continuation of Hospitalization: Aggression Anxiety Depression Medication stabilization  Estimated Length of Stay: 3-5 days   Attendees: Patient: 06/06/2019   Physician:  06/06/2019   Nursing:  06/06/2019   RN Care Manager: 06/06/2019   Social Worker: Ardelle Anton, LCSW 06/06/2019  Recreational Therapist:  06/06/2019   Other:  06/06/2019   Other:  06/06/2019   Other: 06/06/2019     Scribe for Treatment Team: Trecia Rogers, LCSW 06/06/2019 2:41 PM

## 2019-06-07 MED ORDER — FLUOXETINE HCL 20 MG PO CAPS
20.0000 mg | ORAL_CAPSULE | Freq: Every day | ORAL | Status: DC
  Administered 2019-06-07 – 2019-06-10 (×4): 20 mg via ORAL
  Filled 2019-06-07 (×5): qty 1

## 2019-06-07 NOTE — [facility] Counselor (Signed)
CSW called and spoke with admissions at [facility] regarding referral faxed on 02/15. Admissions confirms the referral has been received and is currently being reviewed. No admissions decision has been made at this time.  CSW following.  Enid Cutter, MSW, LCSW-A Clinical Social Worker [facility]  [phone]

## 2019-06-07 NOTE — Progress Notes (Addendum)
[facility] MD Progress Note  06/07/2019 7:34 AM Julie Gentry  MRN:  [id number] Subjective:   Julie Gentry is a 24 year old female that is currently hospitalized for psychosis.  Patient is very emotional this morning. She repeatedly states that she wants to go home because she "is not learning anything". She statements this morning or illogical and circumstantial, with insertions of random points during the conversation. She states she has begun naming her personalities Verdene Lennert, [place], [place]) and denies being herself because "it's too difficult". She is claiming to have "soul her soul" and needs a preacher to restore it. She confirms she still hears auditory hallucinations that are improved when she paces. She kept asking for for "Corene Cornea" who she calls her boyfriend. She states that her PTSD is improving and is the "best its ever been".  She remains emotional labile with normal speech. She denies suicidal ideations.    Principal Problem:/Substance abuse history/PTSD Diagnosis: Active Problems:   Brief psychotic disorder ([place])   Schizophreniform disorder ([place])  Total Time spent with patient: 30 minutes  Past Psychiatric History: As per HPI  Past Medical History: History reviewed. No pertinent past medical history. History reviewed. No pertinent surgical history. Family History: History reviewed. No pertinent family history. Family Psychiatric  History: As per HPI Social History:  Social History   Substance and Sexual Activity  Alcohol Use Not Currently     Social History   Substance and Sexual Activity  Drug Use Yes  . Types: Marijuana   Comment: occasionally    Social History   Socioeconomic History  . Marital status: Single    Spouse name: Not on file  . Number of children: Not on file  . Years of education: 46  . Highest education level: High school graduate  Occupational History  . Occupation: Unemployed  Tobacco Use  . Smoking status: Current Some Day Smoker     Packs/day: 1.00    Types: Cigarettes  . Smokeless tobacco: Never Used  Substance and Sexual Activity  . Alcohol use: Not Currently  . Drug use: Yes    Types: Marijuana    Comment: occasionally  . Sexual activity: Yes    Birth control/protection: None  Other Topics Concern  . Not on file  Social History Narrative   Pt is unemployed; lives in [place] with her father   Social Determinants of Health   Financial Resource Strain:   . Difficulty of Paying Living Expenses: Not on file  Food Insecurity:   . Worried About Charity fundraiser in the Last Year: Not on file  . Ran Out of Food in the Last Year: Not on file  Transportation Needs:   . Lack of Transportation (Medical): Not on file  . Lack of Transportation (Non-Medical): Not on file  Physical Activity:   . Days of Exercise per Week: Not on file  . Minutes of Exercise per Session: Not on file  Stress:   . Feeling of Stress : Not on file  Social Connections:   . Frequency of Communication with Friends and Family: Not on file  . Frequency of Social Gatherings with Friends and Family: Not on file  . Attends Religious Services: Not on file  . Active Member of Clubs or Organizations: Not on file  . Attends Archivist Meetings: Not on file  . Marital Status: Not on file   Additional Social History:  Pain Medications: See MAR Prescriptions: See MAR Over the Counter: See MAR History of alcohol / drug use?:  Yes Longest period of sobriety (when/how long): UTA Negative Consequences of Use: Work / School Name of Substance 1: THC 1 - Frequency: UTA Name of Substance 2: Amphetamines 2 - Frequency: UTA Name of Substance 3: benzos 3 - Frequency: UTA 3 - Last Use / Amount: UTA  Sleep: Good  Appetite:  Good  Current Medications: Current Facility-Administered Medications  Medication Dose Route Frequency Provider Last Rate Last Admin  . acetaminophen (TYLENOL) tablet 650 mg  650 mg Oral Q6H PRN Anike, Adaku C, NP    650 mg at 06/02/19 2107  . busPIRone (BUSPAR) tablet 10 mg  10 mg Oral BID Malvin Johns, MD   10 mg at 06/07/19 0715  . feeding supplement (ENSURE ENLIVE) (ENSURE ENLIVE) liquid 237 mL  237 mL Oral BID BM Malvin Johns, MD   237 mL at 06/06/19 1410  . hydrOXYzine (ATARAX/VISTARIL) tablet 25 mg  25 mg Oral TID PRN Anike, Adaku C, NP   25 mg at 06/07/19 0714  . LORazepam (ATIVAN) tablet 1 mg  1 mg Oral Q6H PRN Antonieta Pert, MD   1 mg at 06/07/19 0714  . nicotine (NICODERM CQ - dosed in mg/24 hours) patch 21 mg  21 mg Transdermal Daily Malvin Johns, MD   21 mg at 06/07/19 0717  . OLANZapine zydis (ZYPREXA) disintegrating tablet 10 mg  10 mg Oral Q8H PRN Antonieta Pert, MD   10 mg at 06/07/19 0626  . omega-3 acid ethyl esters (LOVAZA) capsule 1 g  1 g Oral BID Malvin Johns, MD   1 g at 06/07/19 0714  . pneumococcal 23 valent vaccine (PNEUMOVAX-23) injection 0.5 mL  0.5 mL Intramuscular Tomorrow-1000 Malvin Johns, MD      . prenatal multivitamin tablet 1 tablet  1 tablet Oral Daily Malvin Johns, MD   1 tablet at 06/07/19 [phone]  . risperiDONE (RISPERDAL) tablet 3 mg  3 mg Oral QHS Malvin Johns, MD      . sertraline (ZOLOFT) 20 MG/ML concentrated solution 50 mg  50 mg Oral Daily Malvin Johns, MD   50 mg at 06/07/19 0714  . traZODone (DESYREL) tablet 50 mg  50 mg Oral QHS Anike, Adaku C, NP   50 mg at 06/06/19 0129    Lab Results:  Results for orders placed or performed during the hospital encounter of 05/31/19 (from the past 48 hour(s))  Comprehensive metabolic panel     Status: Abnormal   Collection Time: 06/05/19  6:30 PM  Result Value Ref Range   Sodium 139 135 - 145 mmol/L   Potassium 3.9 3.5 - 5.1 mmol/L   Chloride 109 98 - 111 mmol/L   CO2 22 22 - 32 mmol/L   Glucose, Bld 110 (H) 70 - 99 mg/dL   BUN 22 (H) 6 - 20 mg/dL   Creatinine, Ser 0.24 (L) 0.44 - 1.00 mg/dL   Calcium 9.1 8.9 - 09.7 mg/dL   Total Protein 7.4 6.5 - 8.1 g/dL   Albumin 4.4 3.5 - 5.0 g/dL   AST 32 15 - 41 U/L    ALT 74 (H) 0 - 44 U/L   Alkaline Phosphatase 66 38 - 126 U/L   Total Bilirubin 0.4 0.3 - 1.2 mg/dL   GFR calc non Af Amer >60 >60 mL/min   GFR calc Af Amer >60 >60 mL/min   Anion gap 8 5 - 15    Comment: Performed at [facility], [address], [place], [place] [address]    Blood  Alcohol level:  Lab Results  Component Value Date   ETH <10 03/21/2018    Metabolic Disorder Labs: Lab Results  Component Value Date   HGBA1C 5.1 06/01/2019   MPG 100 06/01/2019   Lab Results  Component Value Date   PROLACTIN 12.7 06/01/2019   Lab Results  Component Value Date   CHOL 178 06/01/2019   TRIG 59 06/01/2019   HDL 54 06/01/2019   CHOLHDL 3.3 06/01/2019   VLDL 12 06/01/2019   LDLCALC 112 (H) 06/01/2019    Physical Findings: AIMS: Facial and Oral Movements Muscles of Facial Expression: None, normal Lips and Perioral Area: None, normal Jaw: None, normal Tongue: None, normal,Extremity Movements Upper (arms, wrists, hands, fingers): None, normal Lower (legs, knees, ankles, toes): None, normal, Trunk Movements Neck, shoulders, hips: None, normal, Overall Severity Severity of abnormal movements (highest score from questions above): None, normal Incapacitation due to abnormal movements: None, normal Patient's awareness of abnormal movements (rate only patient's report): No Awareness, Dental Status Current problems with teeth and/or dentures?: No Does patient usually wear dentures?: No  CIWA:    COWS:     Musculoskeletal: Strength & Muscle Tone: within normal limits Gait & Station: normal Patient leans: N/A  Psychiatric Specialty Exam: Physical Exam  Review of Systems  Blood pressure 111/80, pulse (!) 123, temperature 98.1 F (36.7 C), resp. rate 16, height 5\' 2"  (1.575 m), weight 47.5 kg, SpO2 100 %.Body mass index is 19.15 kg/m.  General Appearance: Casual  Eye Contact:  Good  Speech:  Clear and Coherent  Volume:  Normal  Mood:  Anxious  Affect:   Constricted  Thought Process:  Goal Directed and Descriptions of Associations: Tangential  Orientation:  Full (Time, Place, and Person)  Thought Content:  Denies any hallucinations or thoughts of self-harm but elaborates a baseline of paranoia at night  Suicidal Thoughts:  No  Homicidal Thoughts:  No  Memory:  Immediate;   Fair Recent;   Fair Remote;   Fair  Judgement:  Fair  Insight:  Good and Fair  Psychomotor Activity:  Normal  Concentration:  Concentration: Fair and Attention Span: Fair  Recall:  of Knowledge:  Fair  Language:  Fair  Akathisia:  Negative  Handed:  Right  AIMS (if indicated):     Assets:  Physical Health Resilience Social Support  ADL's:  Intact  Cognition:  WNL  Sleep:  Number of Hours: 8   Treatment Plan Summary: Daily contact with patient to assess and evaluate symptoms and progress in treatment and Medication management  - Continue inpatient hospitalization - Continue current medications - No change in precautions - Start Prozac for tearfulness  Depo planning  [employer], Medical Student 06/07/2019, 7:34 AM

## 2019-06-07 NOTE — Progress Notes (Signed)
Recreation Therapy Notes  Date: 2.16.21 Time: 1000 Location: 500 Hall Dayroom  Group Topic: Self-Esteem  Goal Area(s) Addresses:  Patient will successfully identify positive attributes about themselves.  Patient will successfully identify benefit of improved self-esteem.   Behavioral Response: Engaged  Intervention: Colored pencils, blank masks  Activity: How I See Me.  Patients were to design Gentry blank mask to highlight some of the positive attributes about themselves.     Education:  Self-Esteem, Building control surveyor.   Education Outcome: Acknowledges education/In group clarification offered/Needs additional education  Clinical Observations/Feedback: Pt was engage with the activity but unable to sit still.  Pt designed mask with the face scratched out and when asked about, pt stated it represented her overcoming fear.  Pt also had flowers, birds and duck.  Pt expressed she likes to listen to music and the rain because it's calming.     Julie Gentry, Julie Gentry    Julie Gentry, Julie Gentry 06/07/2019 11:40 AM

## 2019-06-07 NOTE — Progress Notes (Signed)
   06/07/19 0539  Psych Admission Type (Psych Patients Only)  Admission Status Involuntary  Psychosocial Assessment  Patient Complaints Depression  Eye Contact Brief  Facial Expression Worried  Affect Anxious  Speech Soft;Slow  Interaction Assertive  Motor Activity Slow  Appearance/Hygiene Unremarkable  Behavior Characteristics Cooperative  Mood Anxious  Aggressive Behavior  Effect No apparent injury  Thought Process  Coherency WDL  Content WDL  Delusions None reported or observed  Perception WDL  Hallucination None reported or observed  Judgment Poor  Confusion None  Danger to Self  Current suicidal ideation? Denies  Danger to Others  Danger to Others None reported or observed  Pt slept the majority of the shift.

## 2019-06-07 NOTE — Progress Notes (Signed)
Pt walking around the unit agitated: pacing up and down the hall, scratching on the window, pouring her body wash in the trash can and saying random things ("Everyone needs a milkshake. We need to make milkshakes."). Pt given Zyprexa 10 mg.

## 2019-06-07 NOTE — Progress Notes (Signed)
   06/07/19 2040  Psych Admission Type (Psych Patients Only)  Admission Status Involuntary  Psychosocial Assessment  Patient Complaints Depression  Eye Contact Brief  Facial Expression Blank  Affect Anxious  Speech Soft;Slow  Interaction Assertive  Motor Activity Pacing  Appearance/Hygiene Unremarkable  Behavior Characteristics Cooperative;Anxious  Mood Anxious;Sad  Aggressive Behavior  Effect No apparent injury  Thought Process  Coherency WDL  Content WDL  Delusions None reported or observed  Perception WDL  Hallucination Auditory  Judgment Poor  Confusion None  Danger to Self  Current suicidal ideation? Denies  Danger to Others  Danger to Others None reported or observed   Pt still pacing halls. Pt endorses AH that are non-commanding.

## 2019-06-08 LAB — PREGNANCY, URINE: Preg Test, Ur: NEGATIVE

## 2019-06-08 NOTE — Progress Notes (Signed)
Psychoeducational Group Note  Date:  06/08/2019 Time:  2130  Group Topic/Focus:  wrap up group  Participation Level: Did Not Attend  Participation Quality:  Not Applicable  Affect:  Not Applicable  Cognitive:  Not Applicable  Insight:  Not Applicable  Engagement in Group: Not Applicable  Additional Comments:  Pt was notified that group was beginning but remained in bed and fell back to sleep.   Johann Capers S 06/08/2019, 9:00 PM

## 2019-06-08 NOTE — BHH Counselor (Signed)
CSW called and spoke with admissions at [facility]. Patient's referral has been received and approved for admission, but there are no available beds at this time. CSW was encouraged to call back tomorrow regarding an available bed.  Enid Cutter, MSW, LCSW-A Clinical Social Worker [facility]  [phone]

## 2019-06-08 NOTE — Progress Notes (Signed)
Recreation Therapy Notes  Date:  2.17.21 Time: 1000 Location: 500 Hall Dayroom  Group Topic: Stress Management  Goal Area(s) Addresses:  Patient will identify positive stress management techniques. Patient will identify benefits of using stress management post d/c.  Intervention: Stress Management  Activity : Meditation.  LRT a meditation that focused on clearing your chakras and doing positive self talk.  Patients were to listen and mentally focus on saying positive chants to self.  Education: Stress Management, Discharge Planning.   Education Outcome: Acknowledges Education  Clinical Observations/Feedback: Pt did not attend group session.    Caroll Rancher, LRT/CTRS        Caroll Rancher A 06/08/2019 11:50 AM

## 2019-06-08 NOTE — Progress Notes (Signed)
[facility] MD Progress Note  06/08/2019 8:00 AM Julie Gentry  MRN:  [id number] Subjective:   Julie Gentry is a 24 year old female that is currently hospitalized for psychosis.  Patient is improved this morning. She is desiring to go a rehab facility straight after discharge to help with her addiction, mentions wanting her "younger sister" to go with her. Her statements this morning are more logical though still circumferential. She states that she feels "much better" this morning; however, still endorsing some sadness. She continues to pace as she feels this keeps her calm. She still reports that her PTSD is well-managed. She does still have auditory hallucinations, though she states they are not as loud or intrusive as before. She does express some mild distress about not feeling connected to God.  She does spend a lot of time with another patient who is psychotic and it appears that they feed into each other.  She has normal speech with good eye contact. She denies suicidal ideations.    Principal Problem:/Substance abuse history/PTSD Diagnosis: Active Problems:   Brief psychotic disorder ([facility])   Schizophreniform disorder ([facility])  Total Time spent with patient: 30 minutes  Past Psychiatric History: As per HPI  Past Medical History: History reviewed. No pertinent past medical history. History reviewed. No pertinent surgical history. Family History: History reviewed. No pertinent family history. Family Psychiatric  History: As per HPI Social History:  Social History   Substance and Sexual Activity  Alcohol Use Not Currently     Social History   Substance and Sexual Activity  Drug Use Yes  . Types: Marijuana   Comment: occasionally    Social History   Socioeconomic History  . Marital status: Single    Spouse name: Not on file  . Number of children: Not on file  . Years of education: 51  . Highest education level: High school graduate  Occupational History  . Occupation:  Unemployed  Tobacco Use  . Smoking status: Current Some Day Smoker    Packs/day: 1.00    Types: Cigarettes  . Smokeless tobacco: Never Used  Substance and Sexual Activity  . Alcohol use: Not Currently  . Drug use: Yes    Types: Marijuana    Comment: occasionally  . Sexual activity: Yes    Birth control/protection: None  Other Topics Concern  . Not on file  Social History Narrative   Pt is unemployed; lives in [place] with her father   Social Determinants of Health   Financial Resource Strain:   . Difficulty of Paying Living Expenses: Not on file  Food Insecurity:   . Worried About Programme researcher, broadcasting/film/video in the Last Year: Not on file  . Ran Out of Food in the Last Year: Not on file  Transportation Needs:   . Lack of Transportation (Medical): Not on file  . Lack of Transportation (Non-Medical): Not on file  Physical Activity:   . Days of Exercise per Week: Not on file  . Minutes of Exercise per Session: Not on file  Stress:   . Feeling of Stress : Not on file  Social Connections:   . Frequency of Communication with Friends and Family: Not on file  . Frequency of Social Gatherings with Friends and Family: Not on file  . Attends Religious Services: Not on file  . Active Member of Clubs or Organizations: Not on file  . Attends Banker Meetings: Not on file  . Marital Status: Not on file   Additional Social History:  Pain Medications: See MAR Prescriptions: See MAR Over the Counter: See MAR History of alcohol / drug use?: Yes Longest period of sobriety (when/how long): UTA Negative Consequences of Use: Work / School Name of Substance 1: THC 1 - Frequency: UTA Name of Substance 2: Amphetamines 2 - Frequency: UTA Name of Substance 3: benzos 3 - Frequency: UTA 3 - Last Use / Amount: UTA  Sleep: Good  Appetite:  Good  Current Medications: Current Facility-Administered Medications  Medication Dose Route Frequency Provider Last Rate Last Admin  .  acetaminophen (TYLENOL) tablet 650 mg  650 mg Oral Q6H PRN Anike, Adaku C, NP   650 mg at 06/02/19 2107  . busPIRone (BUSPAR) tablet 10 mg  10 mg Oral BID Malvin Johns, MD   10 mg at 06/07/19 1832  . feeding supplement (ENSURE ENLIVE) (ENSURE ENLIVE) liquid 237 mL  237 mL Oral BID BM Malvin Johns, MD   237 mL at 06/07/19 1319  . FLUoxetine (PROZAC) capsule 20 mg  20 mg Oral Daily Malvin Johns, MD   20 mg at 06/07/19 1319  . hydrOXYzine (ATARAX/VISTARIL) tablet 25 mg  25 mg Oral TID PRN Anike, Adaku C, NP   25 mg at 06/08/19 0522  . LORazepam (ATIVAN) tablet 1 mg  1 mg Oral Q6H PRN Antonieta Pert, MD   1 mg at 06/08/19 0522  . nicotine (NICODERM CQ - dosed in mg/24 hours) patch 21 mg  21 mg Transdermal Daily Malvin Johns, MD   21 mg at 06/07/19 0717  . OLANZapine zydis (ZYPREXA) disintegrating tablet 10 mg  10 mg Oral Q8H PRN Antonieta Pert, MD   10 mg at 06/07/19 0626  . omega-3 acid ethyl esters (LOVAZA) capsule 1 g  1 g Oral BID Malvin Johns, MD   1 g at 06/07/19 1833  . pneumococcal 23 valent vaccine (PNEUMOVAX-23) injection 0.5 mL  0.5 mL Intramuscular Tomorrow-1000 Malvin Johns, MD      . prenatal multivitamin tablet 1 tablet  1 tablet Oral Daily Malvin Johns, MD   1 tablet at 06/07/19 [phone]  . risperiDONE (RISPERDAL) tablet 3 mg  3 mg Oral QHS Malvin Johns, MD   3 mg at 06/07/19 2013  . traZODone (DESYREL) tablet 50 mg  50 mg Oral QHS Anike, Adaku C, NP   50 mg at 06/07/19 2013    Lab Results:  No results found for this or any previous visit (from the past 48 hour(s)).  Blood Alcohol level:  Lab Results  Component Value Date   ETH <10 03/21/2018    Metabolic Disorder Labs: Lab Results  Component Value Date   HGBA1C 5.1 06/01/2019   MPG 100 06/01/2019   Lab Results  Component Value Date   PROLACTIN 12.7 06/01/2019   Lab Results  Component Value Date   CHOL 178 06/01/2019   TRIG 59 06/01/2019   HDL 54 06/01/2019   CHOLHDL 3.3 06/01/2019   VLDL 12 06/01/2019    LDLCALC 112 (H) 06/01/2019    Physical Findings: AIMS: Facial and Oral Movements Muscles of Facial Expression: None, normal Lips and Perioral Area: None, normal Jaw: None, normal Tongue: None, normal,Extremity Movements Upper (arms, wrists, hands, fingers): None, normal Lower (legs, knees, ankles, toes): None, normal, Trunk Movements Neck, shoulders, hips: None, normal, Overall Severity Severity of abnormal movements (highest score from questions above): None, normal Incapacitation due to abnormal movements: None, normal Patient's awareness of abnormal movements (rate only patient's report): No Awareness, Dental Status Current problems with teeth  and/or dentures?: No Does patient usually wear dentures?: No  CIWA:    COWS:     Musculoskeletal: Strength & Muscle Tone: within normal limits Gait & Station: normal Patient leans: N/A  Psychiatric Specialty Exam: Physical Exam  Review of Systems  Blood pressure 120/81, pulse (!) 124, temperature 97.8 F (36.6 C), resp. rate 18, height 5\' 2"  (1.575 m), weight 47.5 kg, SpO2 100 %.Body mass index is 19.15 kg/m.  General Appearance: Casual  Eye Contact:  Good  Speech:  Clear and Coherent  Volume:  Normal  Mood:  Anxious  Affect:  Constricted  Thought Process:  Goal Directed and Descriptions of Associations: Tangential  Orientation:  Full (Time, Place, and Person)  Thought Content:  Denies any hallucinations or thoughts of self-harm but elaborates a baseline of paranoia at night  Suicidal Thoughts:  No  Homicidal Thoughts:  No  Memory:  Immediate;   Fair Recent;   Fair Remote;   Fair  Judgement:  Fair  Insight:  Good and Fair  Psychomotor Activity:  Normal  Concentration:  Concentration: Fair and Attention Span: Fair  Recall:  [employer] of Knowledge:  Fair  Language:  Fair  Akathisia:  Negative  Handed:  Right  AIMS (if indicated):     Assets:  Physical Health Resilience Social Support  ADL's:  Intact  Cognition:  WNL   Sleep:  Number of Hours: 6.75   Treatment Plan Summary: Daily contact with patient to assess and evaluate symptoms and progress in treatment and Medication management  - Continue inpatient hospitalization - Consider rehab facility - Switch to Adult [facility] - Continue current medications - No change in precautions  Depo planning  Fonnie Mu, Medical Student 06/08/2019, 8:00 AM

## 2019-06-08 NOTE — Progress Notes (Signed)
   06/08/19 2000  Psych Admission Type (Psych Patients Only)  Admission Status Voluntary  Psychosocial Assessment  Patient Complaints Depression  Eye Contact Brief  Facial Expression Blank  Affect Anxious  Speech Soft;Slow  Interaction Assertive  Motor Activity Pacing  Appearance/Hygiene Unremarkable  Behavior Characteristics Cooperative  Mood Anxious  Aggressive Behavior  Effect No apparent injury  Thought Process  Coherency WDL  Content WDL  Delusions None reported or observed  Perception WDL  Hallucination None reported or observed  Judgment Poor  Confusion None  Danger to Self  Current suicidal ideation? Denies  Danger to Others  Danger to Others None reported or observed

## 2019-06-08 NOTE — Progress Notes (Signed)
Patient ID: Julie Gentry, female   DOB: 04-Aug-1995, 24 y.o.   MRN: [id number] [facility] MD Progress Note  06/08/2019 4:10 PM  Julie Gentry  MRN: [id number]  Subjective: Patient reports she is hopeful for discharge soon. She describes some lingering depression and states she is hopeful that her current medication regimen will help her mood continue to improve. Describes ongoing PTSD symptoms, primarily intrusive memories of past traumatic events. Today denies suicidal ideations.  Objective: I have reviewed chart notes and have met with patient  24 year old female, initially presented to [facility] on 1/25 for psychotic symptoms and suicidal ideations. At the time UDS was positive for amphetamines and benzodiazepines. She was admitted to [facility], but after discharge was noted by her father to continue to exhibit psychotic symptoms due to which he took her back to the hospital and she was readmitted. As per chart notes she was behaving bizarrely with disorganized behaviors and speech.  Patient reports history of PTSD and acknowledges prior substance abuse. States she has used methamphetamine in the past although "last time I used was like about a month ago". She also reports the time sometimes took Xanax (not prescribed to her) but states this was not regular or daily. 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 Currently denies suicidal ideations. She is future oriented and states she is hopeful for discharge soon. As per chart notes CSW team has referred her to [facility] but no bed availability at this time. Patient is currently stating she would consider going to a rehab but would prefer to go home for at least a brief period of time first.  Denies medication side effects.  She presents vaguely anxious,  irritable. Denies SI. Currently denies hallucinations and does not appear internally preoccupied.  She is currently on Prozac 20 mg daily, risperidone 3 mg nightly, BuSpar 10 mg twice daily. She is also on Zyprexa as needed for agitation as needed.  Principal Problem:/Substance abuse history/PTSD  Diagnosis: Active Problems:  Brief psychotic disorder ([place])  Schizophreniform disorder ([place])   Total Time spent with patient: 20 minutes  Past Psychiatric History: As per HPI  Past Medical History: History reviewed. No pertinent past medical history. History reviewed. No pertinent surgical history.  Family History: History reviewed. No pertinent family history.  Family Psychiatric History: As per HPI  Social History:  Social History      Substance and Sexual Activity  Alcohol Use Not Currently   Social History       Substance and Sexual Activity  Drug Use Yes  . Types: Marijuana   Comment: occasionally   Social History        Socioeconomic History  . Marital status: Single    Spouse name: Not on file  . Number of children: Not on file  . Years of education: 65  . Highest education level: High school graduate  Occupational History  . Occupation: Unemployed  Tobacco Use  . Smoking status: Current Some Day Smoker    Packs/day: 1.00    Types: Cigarettes  . Smokeless tobacco: Never Used  Substance and Sexual Activity  . Alcohol use: Not Currently  . Drug use: Yes    Types: Marijuana    Comment: occasionally  . Sexual activity: Yes    Birth control/protection: None  Other Topics Concern  . Not on file  Social History Narrative   Pt  is unemployed; lives in [place] with her father   Social Determinants of Health      Financial Resource Strain:   . Difficulty of Paying Living Expenses: Not on file  Food Insecurity:   . Worried About Charity fundraiser in the Last Year: Not on file  . Ran Out of Food in the Last Year: Not on file  Transportation Needs:   . Lack of  Transportation (Medical): Not on file  . Lack of Transportation (Non-Medical): Not on file  Physical Activity:   . Days of Exercise per Week: Not on file  . Minutes of Exercise per Session: Not on file  Stress:   . Feeling of Stress : Not on file  Social Connections:   . Frequency of Communication with Friends and Family: Not on file  . Frequency of Social Gatherings with Friends and Family: Not on file  . Attends Religious Services: Not on file  . Active Member of Clubs or Organizations: Not on file  . Attends Archivist Meetings: Not on file  . Marital Status: Not on file   Additional Social History:  Pain Medications: See MAR  Prescriptions: See MAR  Over the Counter: See MAR  History of alcohol / drug use?: Yes  Longest period of sobriety (when/how long): UTA  Negative Consequences of Use: Work / School  Name of Substance 1: THC  1 - Frequency: [place]  Name of Substance 2: Amphetamines  2 - Frequency: [place]  Name of Substance 3: benzos  3 - Frequency: UTA  3 - Last Use / Amount: UTA  Sleep: Good  Appetite: Good  Current Medications:           Current Facility-Administered Medications  Medication Dose Route Frequency Provider Last Rate Last Admin  . acetaminophen (TYLENOL) tablet 650 mg 650 mg Oral Q6H PRN Anike, Adaku C, NP  650 mg at 06/02/19 2107  . busPIRone (BUSPAR) tablet 10 mg 10 mg Oral BID Johnn Hai, MD  10 mg at 06/08/19 0805  . feeding supplement (ENSURE ENLIVE) (ENSURE ENLIVE) liquid 237 mL 237 mL Oral BID BM Johnn Hai, MD  237 mL at 06/08/19 1057  . FLUoxetine (PROZAC) capsule 20 mg 20 mg Oral Daily Johnn Hai, MD  20 mg at 06/08/19 0805  . hydrOXYzine (ATARAX/VISTARIL) tablet 25 mg 25 mg Oral TID PRN Anike, Adaku C, NP  25 mg at 06/08/19 0522  . LORazepam (ATIVAN) tablet 1 mg 1 mg Oral Q6H PRN Sharma Covert, MD  1 mg at 06/08/19 1350  . nicotine (NICODERM CQ - dosed in mg/24 hours) patch 21 mg 21 mg Transdermal Daily Johnn Hai, MD  21 mg  at 06/08/19 0802  . OLANZapine zydis (ZYPREXA) disintegrating tablet 10 mg 10 mg Oral Q8H PRN Sharma Covert, MD  10 mg at 06/08/19 0806  . omega-3 acid ethyl esters (LOVAZA) capsule 1 g 1 g Oral BID Johnn Hai, MD  1 g at 06/08/19 0805  . pneumococcal 23 valent vaccine (PNEUMOVAX-23) injection 0.5 mL 0.5 mL Intramuscular Tomorrow-1000 Johnn Hai, MD    . prenatal multivitamin tablet 1 tablet 1 tablet Oral Daily Johnn Hai, MD  1 tablet at 06/08/19 0805  . risperiDONE (RISPERDAL) tablet 3 mg 3 mg Oral QHS Johnn Hai, MD  3 mg at 06/07/19 2013  . traZODone (DESYREL) tablet 50 mg 50 mg Oral QHS Anike, Adaku C, NP  50 mg at 06/07/19 2013   Lab Results:  Lab Results Last 48  Hours    Blood Alcohol level:  Recent Labs                      Metabolic Disorder Labs:  Recent Labs                             Recent Labs                       Recent Labs                                                    Physical Findings:  AIMS: Facial and Oral Movements  Muscles of Facial Expression: None, normal  Lips and Perioral Area: None, normal  Jaw: None, normal  Tongue: None, normal,Extremity Movements  Upper (arms, wrists, hands, fingers): None, normal  Lower (legs, knees, ankles, toes): None, normal, Trunk Movements  Neck, shoulders, hips: None, normal, Overall Severity  Severity of abnormal movements (highest score from questions above): None, normal  Incapacitation due to abnormal movements: None, normal  Patient's awareness of abnormal movements (rate only patient's report): No Awareness, Dental Status  Current problems with teeth and/or dentures?: No  Does patient usually wear dentures?: No  CIWA:  COWS:  Musculoskeletal:  Strength & Muscle Tone: within normal limits  Gait & Station: normal  Patient leans: N/A  Psychiatric Specialty Exam:  Physical Exam  Review of Systems no chest pain, no nausea, no vomiting  Blood pressure 120/81, pulse (!) 124,  temperature 97.8 F (36.6 C), resp. rate 18, height '5\' 2"'$  (1.575 m), weight 47.5 kg, SpO2 100 %.Body mass index is 19.15 kg/m.  General Appearance: Fairly groomed  Eye Contact: Good  Speech: Normal  Volume: Normal  Mood: Acknowledges partially improved mood, reports still feeling some depression   Affect: Vaguely irritable and dysphoric  Thought Process: Goal Directed and Descriptions of Associations: Tangential-generally linear, becomes tangential with open-ended questions at times  Orientation: Full (Time, Place, and Person).  Presents fully alert and attentive, oriented x3  Thought Content: Currently denies hallucinations, no delusions are expressed, does not currently present internally preoccupied  Suicidal Thoughts: No at this time denies suicidal or self-injurious ideations  Homicidal Thoughts: No  Memory: Recent and remote fair  Judgement: Fair  Insight:  Fair  Psychomotor Activity: Normal-no overt psychomotor agitation.   Concentration: Concentration: Fair and Attention Span: Fair  Recall: [employer] of Knowledge: Fair  Language: Fair  Akathisia: Negative  Handed: Right  AIMS (if indicated):   Assets: Physical Health  Resilience  Social Support  ADL's: Intact  Cognition: WNL  Sleep: Number of Hours: 6.75    Assessment 24 year old female, initially presented to [facility] on 1/25 for psychotic symptoms and suicidal ideations. At the time UDS was positive for amphetamines and benzodiazepines. She was admitted to [facility], but after discharge was noted by her father to continue to exhibit psychotic symptoms due to which he took her back to the hospital and she was readmitted. As per chart notes she was behaving bizarrely with disorganized behaviors and speech.   Patient was transferred from 500unit to 300 unit today.  She reports partially improved mood compared to admission and currently denies SI and presents future oriented, hopeful for discharge  soon.  She states she will consider going to a rehab but would prefer to go home for a brief period of time initially if possible.  She describes a history of PTSD and ongoing intrusive memories stemming from past traumatic events.  Presents vaguely anxious and irritable but behavior in good control.  Currently on Risperdal and Prozac, denies side effects  Treatment Plan Summary:  Daily contact with patient to assess and evaluate symptoms and progress in treatment and Medication management  Treatment plan reviewed as below Encourage group and milieu participation Encourage efforts to work on sobriety and relapse prevention Treatment team working on disposition planning options-as above referral to rehab facility is being worked on [employer] 10 mg twice daily for anxiety Continue Prozac 20 mg daily for depression, anxiety, PTSD Continue Risperidone 3 mg nightly for mood disorder/psychosis Continue Zyprexa 10 mg PRNfor agitation if needed Continue Trazodone 50 mg nightly as needed for insomnia  Jenne Campus, MD  06/08/2019, 4:10 PM  Patient ID: Julie Gentry, female DOB: May 14, 1995, 24 y.o. MRN: [id number]

## 2019-06-08 NOTE — Progress Notes (Signed)
Pt transferred to room 302-2.  Pt has settled in and is adjusting to her new environment.  PRN Zyprexa administered because pt was becoming increasingly agitated.  Pt was able to calm down after taking the Zyprexa.  Pt is currently eating dinner in her room and she is pleasant at this time.  RN will continue to monitor and provide support as needed.       06/08/19 1100  Psych Admission Type (Psych Patients Only)  Admission Status Voluntary  Psychosocial Assessment  Patient Complaints Depression  Eye Contact Brief  Facial Expression Blank  Affect Anxious  Speech Soft;Slow  Interaction Assertive  Motor Activity Pacing  Appearance/Hygiene Unremarkable  Behavior Characteristics Cooperative;Anxious  Mood Anxious  Aggressive Behavior  Effect No apparent injury  Thought Process  Coherency WDL  Content WDL  Delusions None reported or observed  Perception WDL  Hallucination None reported or observed  Judgment Poor  Confusion None  Danger to Self  Current suicidal ideation? Denies  Danger to Others  Danger to Others None reported or observed

## 2019-06-08 NOTE — Progress Notes (Signed)
Pt wants to be transferred to rehab. Told pt to talk to provider and CSW about this if its possible.

## 2019-06-08 NOTE — [facility] Group Notes (Signed)
06/08/2019 8:45am Type of Group and Topic: Psychoeducational Group: Discharge Planning  Participation Level: Minimal  Description of Group Discharge planning group reviews patient's anticipated discharge plans and assists patients to anticipate and address any barriers to wellness/recovery in the community. Suicide prevention education is reviewed with patients in group. Therapeutic Goals 1. Patients will state their anticipated discharge plan and mental health aftercare 2. Patients will identify potential barriers to wellness in the community setting 3. Patients will engage in problem solving, solution focused discussion of ways to anticipate and address barriers to wellness/recovery   Summary of Patient Progress Plan for Discharge/Comments: Julie Gentry paced continuously throughout the group session, however she did share that "I don't know where I am going". Julie Gentry states she "does not know what to do".   Transportation Means: Patient is unsure of her transportation means at this time.    Supports: Patient did not identify any supports at this time.    Therapeutic Modalities: Motivational Interviewing     Baldo Daub, MSW, LCSWA Clinical Social Worker [facility]  Phone: [phone] 06/08/2019 1:48 PM

## 2019-06-09 MED ORDER — BUSPIRONE HCL 15 MG PO TABS
15.0000 mg | ORAL_TABLET | Freq: Three times a day (TID) | ORAL | Status: DC
  Administered 2019-06-09 – 2019-06-11 (×7): 15 mg via ORAL
  Filled 2019-06-09 (×14): qty 1

## 2019-06-09 NOTE — Plan of Care (Signed)
Progress note  D: pt found in bed; compliant with medication administration. Pt denies any physical complaints. Pt does seem agitated and complains of anxiety. Pt states they slept well. Pt is still flat in their affect. Pt continues to pace but states they want exercise. Pt is less labile since moving from 500. Pt does continue to change clothes frequently for no stated reason. Pt denies si/hi/ah/vh and verbally agrees to approach staff if these become apparent or before harming themself/others while at [facility].  A: Pt provided support and encouragement. Pt given medication per protocol and standing orders. Q73m safety checks implemented and continued.  R: Pt safe on the unit. Will continue to monitor.  Pt progressing in the following metrics  Problem: Education: Goal: Knowledge of [place] [facility] Education information/materials will improve Outcome: Progressing Goal: Emotional status will improve Outcome: Progressing Goal: Mental status will improve Outcome: Progressing Goal: Verbalization of understanding the information provided will improve Outcome: Progressing

## 2019-06-09 NOTE — Progress Notes (Signed)
[facility] Group Notes:  (Nursing/MHT/Case Management/Adjunct)  Date:  06/09/2019  Time:  2030  Type of Therapy:  wrap up group  Participation Level:  Active  Participation Quality:  Appropriate, Attentive, Sharing and Supportive  Affect:  Anxious  Cognitive:  Disorganized  Insight:  Improving  Engagement in Group:  Engaged  Modes of Intervention:  Clarification, Education and Support  Summary of Progress/Problems: Positive thinking and positive change were discussed. Pt reports enjoying listening to music today and it was just the right "frequency".   Johann Capers S 06/09/2019, 9:09 PM

## 2019-06-09 NOTE — Progress Notes (Signed)
Adult Psychoeducational Group Note  Date:  06/09/2019 Time:  9:12 AM  Group Topic/Focus:  Goals Group:   The focus of this group is to help patients establish daily goals to achieve during treatment and discuss how the patient can incorporate goal setting into their daily lives to aide in recovery.  Participation Level:  Active  Participation Quality:  Appropriate  Affect:  Flat  Cognitive:  Appropriate  Insight: Appropriate  Engagement in Group:  Engaged  Modes of Intervention:  Confrontation  Additional Comments:  Pt has a goal of helping develop their discharge plan as well as figure out a plan for aftercare.   Julie Gentry 06/09/2019, 9:12 AM

## 2019-06-09 NOTE — Progress Notes (Signed)
   06/09/19 2100  Psych Admission Type (Psych Patients Only)  Admission Status Voluntary  Psychosocial Assessment  Patient Complaints Anxiety;Suspiciousness  Eye Contact Fair  Facial Expression Blank;Flat  Affect Anxious;Preoccupied  Speech Logical/coherent  Interaction Assertive  Motor Activity Fidgety;Pacing;Restless  Appearance/Hygiene Unremarkable  Behavior Characteristics Cooperative  Mood Anxious;Preoccupied;Suspicious  Thought Process  Coherency WDL  Content Delusions  Delusions Religious  Perception Derealization;Hallucinations  Hallucination Auditory;Visual  Judgment Poor  Confusion None  Danger to Self  Current suicidal ideation? Denies

## 2019-06-09 NOTE — Progress Notes (Addendum)
Chaplain provided support with pt while rounding on unit and in response to referral from NT.   Julie Gentry presented with disorganized thinking.  Some language around self-blame (I.e. "I did this to myself"), but not connected to any content.  Julie Gentry was also able to relate that she is "going to rehab," but is unsure where - noting it depends on payment.   Chaplain provided empathic presence and encouraged safety and connection on unit.

## 2019-06-09 NOTE — Progress Notes (Signed)
[facility] MD Progress Note  06/09/2019 8:30 AM Julie Gentry  MRN:  [id number] Subjective:   Patient states she did sleep she requests to go back to the 500 hall but we explained to her wanted at home or setting for her recovery.  She denies current auditory hallucinations stating "none today so far" she is making fewer disjointed statements.  Alert and oriented to person place situation time.  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 No pertinent past medical history. History reviewed. No pertinent surgical history. Family History: History reviewed. No pertinent family history. Family Psychiatric  History: No new data Social History:  Social History   Substance and Sexual Activity  Alcohol Use Not Currently     Social History   Substance and Sexual Activity  Drug Use Yes  . Types: Marijuana   Comment: occasionally    Social History   Socioeconomic History  . Marital status: Single    Spouse name: Not on file  . Number of children: Not on file  . Years of education: 23  . Highest education level: High school graduate  Occupational History  . Occupation: Unemployed  Tobacco Use  . Smoking status: Current Some Day Smoker    Packs/day: 1.00    Types: Cigarettes  . Smokeless tobacco: Never Used  Substance and Sexual Activity  . Alcohol use: Not Currently  . Drug use: Yes    Types: Marijuana    Comment: occasionally  . Sexual activity: Yes    Birth control/protection: None  Other Topics Concern  . Not on file  Social History Narrative   Pt is  unemployed; lives in [place] with her father   Social Determinants of Health   Financial Resource Strain:   . Difficulty of Paying Living Expenses: Not on file  Food Insecurity:   . Worried About Programme researcher, broadcasting/film/video in the Last Year: Not on file  . Ran Out of Food in the Last Year: Not on file  Transportation Needs:   . Lack of Transportation (Medical): Not on file  . Lack of Transportation (Non-Medical): Not on file  Physical Activity:   . Days of Exercise per Week: Not on file  . Minutes of Exercise per Session: Not on file  Stress:   . Feeling of Stress : Not on file  Social Connections:   . Frequency of Communication with Friends and Family: Not on file  . Frequency of Social Gatherings with Friends and Family: Not on file  . Attends Religious Services: Not on file  . Active Member of Clubs or Organizations: Not on file  . Attends Banker Meetings: Not on file  . Marital Status: Not on file   Additional Social History:    Pain Medications: See MAR Prescriptions: See MAR Over the Counter: See MAR History of alcohol / drug use?: Yes Longest period of sobriety (when/how long): UTA Negative Consequences of Use: Work / School Name of Substance 1: THC 1 - Frequency: UTA Name of Substance 2: Amphetamines 2 - Frequency: UTA Name of Substance 3: benzos 3 - Frequency: UTA 3 - Last Use / Amount: UTA  Sleep: Good  Appetite:  Good  Current Medications: Current Facility-Administered Medications  Medication Dose Route Frequency Provider Last Rate Last Admin  . acetaminophen (TYLENOL) tablet 650 mg  650 mg Oral Q6H PRN Anike, Adaku C, NP   650 mg at 06/02/19 2107  . busPIRone (BUSPAR) tablet 10 mg  10 mg Oral BID Johnn Hai, MD   10 mg at 06/09/19 0732  . feeding supplement (ENSURE ENLIVE) (ENSURE ENLIVE) liquid 237 mL  237 mL Oral BID BM Johnn Hai, MD   237 mL at 06/08/19 1610  . FLUoxetine (PROZAC) capsule 20 mg  20 mg Oral Daily Johnn Hai, MD   20 mg at 06/09/19 0732  . hydrOXYzine (ATARAX/VISTARIL) tablet 25 mg  25 mg Oral TID PRN Anike, Adaku C, NP   25 mg at 06/09/19 0734  . LORazepam (ATIVAN) tablet 1 mg  1 mg Oral Q6H PRN Sharma Covert, MD   1 mg at 06/09/19 0601  . nicotine (NICODERM CQ - dosed in mg/24 hours) patch 21 mg  21 mg Transdermal Daily Johnn Hai, MD   21 mg at 06/09/19 0732  . OLANZapine zydis (ZYPREXA) disintegrating tablet 10 mg  10 mg Oral Q8H PRN Sharma Covert, MD   10 mg at 06/09/19 0109  . omega-3 acid ethyl esters (LOVAZA) capsule 1 g  1 g Oral BID Johnn Hai, MD   1 g at 06/09/19 0732  . pneumococcal 23 valent vaccine (PNEUMOVAX-23) injection 0.5 mL  0.5 mL Intramuscular Tomorrow-1000 Johnn Hai, MD      . prenatal multivitamin tablet 1 tablet  1 tablet Oral Daily Johnn Hai, MD   1 tablet at 06/09/19 0732  . risperiDONE (RISPERDAL) tablet 3 mg  3 mg Oral QHS Johnn Hai, MD   3 mg at 06/08/19 2149  . traZODone (DESYREL) tablet 50 mg  50 mg Oral QHS Anike, Adaku C, NP   50 mg at 06/08/19 2149    Lab Results:  Results for orders placed or performed during the hospital encounter of 05/31/19 (from the past 48 hour(s))  Pregnancy, urine     Status: None   Collection Time: 06/08/19  5:44 PM  Result Value Ref Range   Preg Test, Ur NEGATIVE NEGATIVE    Comment:        THE SENSITIVITY OF THIS METHODOLOGY IS >20 mIU/mL. Performed at [facility], [place] [address], [place], [place] [address]     Blood Alcohol level:  Lab Results  Component Value Date   ETH <10 82/50/5397    Metabolic Disorder Labs: Lab Results  Component Value Date   HGBA1C 5.1 06/01/2019   MPG 100 06/01/2019   Lab Results  Component Value Date   PROLACTIN 12.7 06/01/2019   Lab Results  Component Value Date   CHOL 178 06/01/2019   TRIG 59 06/01/2019   HDL 54 06/01/2019   CHOLHDL 3.3 06/01/2019   VLDL 12 06/01/2019   LDLCALC 112 (H) 06/01/2019    Physical Findings: AIMS:  Facial and Oral Movements Muscles of Facial Expression: None, normal Lips and Perioral Area: None, normal Jaw: None, normal Tongue: None, normal,Extremity Movements Upper (arms, wrists, hands, fingers): None, normal Lower (legs, knees, ankles, toes): None, normal, Trunk Movements Neck, shoulders, hips: None, normal, Overall Severity Severity of abnormal movements (highest score from questions above): None, normal Incapacitation due to abnormal movements: None, normal Patient's awareness of abnormal movements (rate only patient's report): No Awareness, Dental Status Current problems with teeth and/or  dentures?: No Does patient usually wear dentures?: No  CIWA:    COWS:     Musculoskeletal: Strength & Muscle Tone: within normal limits Gait & Station: normal Patient leans: N/A  Psychiatric Specialty Exam: Physical Exam  Review of Systems  Blood pressure 116/79, pulse (!) 134, temperature 98 F (36.7 C), temperature source Oral, resp. rate 16, height 5\' 2"  (1.575 m), weight 47.5 kg, SpO2 99 %.Body mass index is 19.15 kg/m.  General Appearance: Casual  Eye Contact:  Fair  Speech:  Slow  Volume:  Decreased  Mood:  Anxious and Dysphoric  Affect:  Blunt  Thought Process:  Linear and Descriptions of Associations: Circumstantial  Orientation:  Full (Time, Place, and Person)  Thought Content:  Denies current auditory or visual hallucinations  Suicidal Thoughts:  No  Homicidal Thoughts:  No  Memory:  Immediate;   Good Recent;   Fair Remote;   Fair  Judgement:  Fair  Insight:  Fair  Psychomotor Activity:  Normal  Concentration:  Concentration: Fair and Attention Span: Fair  Recall:  of Knowledge:  Fair  Language:  Fair  Akathisia:  Negative  Handed:  Right  AIMS (if indicated):     Assets:  Communication Skills Desire for Improvement  ADL's:  Intact  Cognition:  WNL  Sleep:  Number of Hours: 6.75     Treatment Plan Summary: Daily contact with patient to  assess and evaluate symptoms and progress in treatment and Medication management  Escalate BuSpar for anxiety, continue fluoxetine for dysphoria, continue as needed Zyprexa but standing risperidone no change in precautions  Ritika Hellickson, MD 06/09/2019, 8:30 AM

## 2019-06-09 NOTE — [facility] Counselor (Signed)
CSW spoke with admissions at [facility]. Patient's referral has been accepted but there are still no available beds at this time. CSW was advised to call next week on Monday to be provided with an acceptance date and time.  Patient was declined by [facility] and [facility] does not have available beds.  CSW continuing to follow for a safe disposition.  Enid Cutter, MSW, LCSW-A Clinical Social Worker [facility]  [phone]

## 2019-06-10 MED ORDER — TRAZODONE HCL 150 MG PO TABS
150.0000 mg | ORAL_TABLET | Freq: Every day | ORAL | Status: DC
  Administered 2019-06-10: 150 mg via ORAL
  Filled 2019-06-10 (×3): qty 1

## 2019-06-10 MED ORDER — PERPHENAZINE 4 MG PO TABS
4.0000 mg | ORAL_TABLET | Freq: Three times a day (TID) | ORAL | Status: DC
  Administered 2019-06-10 – 2019-06-13 (×9): 4 mg via ORAL
  Filled 2019-06-10 (×14): qty 1

## 2019-06-10 MED ORDER — FLUOXETINE HCL 10 MG PO CAPS
10.0000 mg | ORAL_CAPSULE | Freq: Every day | ORAL | Status: DC
  Administered 2019-06-11 – 2019-06-14 (×4): 10 mg via ORAL
  Filled 2019-06-10 (×7): qty 1

## 2019-06-10 NOTE — Progress Notes (Signed)
Pt has been awake since 0330 am. Pt turning light on and off, pacing in her room, pacing in the hallway, going in and of the room. Pt has changed clothes approximately  12 times. Pt is redirectable but must be redirected every five minutes. Pt starts activities and stops 30 seconds later, workbooks, math problems, organizing belongings. Pt is disorganized in thought. Pt is disturbing roommate and roommate is getting more confused with lack of sleep. Pt in bathroom at 0700 with all blankets and sheets on floor with water in shower running.

## 2019-06-10 NOTE — Progress Notes (Signed)
Pt continues to be restless pacing the hallway, numerous trips to the nursing station, not able to calm down, pt up taking shower in middle of the night.

## 2019-06-10 NOTE — Tx Team (Signed)
Interdisciplinary Treatment and Diagnostic Plan Update  06/10/2019 Time of Session: 9:10am  Julie Gentry MRN: [id number]  Principal Diagnosis: <principal problem not specified>  Secondary Diagnoses: Active Problems:   Brief psychotic disorder (HCC)   Schizophreniform disorder (HCC)   Current Medications:  Current Facility-Administered Medications  Medication Dose Route Frequency Provider Last Rate Last Admin  . acetaminophen (TYLENOL) tablet 650 mg  650 mg Oral Q6H PRN Anike, Adaku C, NP   650 mg at 06/09/19 2059  . busPIRone (BUSPAR) tablet 15 mg  15 mg Oral TID Malvin Johns, MD   15 mg at 06/10/19 6803  . feeding supplement (ENSURE ENLIVE) (ENSURE ENLIVE) liquid 237 mL  237 mL Oral BID BM Malvin Johns, MD   237 mL at 06/10/19 0913  . FLUoxetine (PROZAC) capsule 20 mg  20 mg Oral Daily Malvin Johns, MD   20 mg at 06/10/19 2122  . hydrOXYzine (ATARAX/VISTARIL) tablet 25 mg  25 mg Oral TID PRN Anike, Adaku C, NP   25 mg at 06/10/19 0914  . nicotine (NICODERM CQ - dosed in mg/24 hours) patch 21 mg  21 mg Transdermal Daily Malvin Johns, MD   21 mg at 06/10/19 0727  . OLANZapine zydis (ZYPREXA) disintegrating tablet 10 mg  10 mg Oral Q8H PRN Antonieta Pert, MD   10 mg at 06/10/19 0353  . omega-3 acid ethyl esters (LOVAZA) capsule 1 g  1 g Oral BID Malvin Johns, MD   1 g at 06/10/19 4825  . pneumococcal 23 valent vaccine (PNEUMOVAX-23) injection 0.5 mL  0.5 mL Intramuscular Tomorrow-1000 Malvin Johns, MD      . prenatal multivitamin tablet 1 tablet  1 tablet Oral Daily Malvin Johns, MD   1 tablet at 06/10/19 0037  . risperiDONE (RISPERDAL) tablet 3 mg  3 mg Oral QHS Malvin Johns, MD   3 mg at 06/09/19 2059  . traZODone (DESYREL) tablet 50 mg  50 mg Oral QHS Anike, Adaku C, NP   50 mg at 06/09/19 2059   PTA Medications: Medications Prior to Admission  Medication Sig Dispense Refill Last Dose  . ARIPiprazole (ABILIFY) 5 MG tablet Take 1 tablet by mouth daily.     . busPIRone  (BUSPAR) 10 MG tablet Take 1 tablet by mouth 2 (two) times daily.     . hydrOXYzine (ATARAX/VISTARIL) 50 MG tablet Take 1 tablet by mouth 3 (three) times daily.     . sertraline (ZOLOFT) 100 MG tablet Take 1 tablet by mouth daily.       Patient Stressors: Legal issue Medication change or noncompliance  Patient Strengths: Average or above average intelligence Physical Health Supportive family/friends  Treatment Modalities: Medication Management, Group therapy, Case management,  1 to 1 session with clinician, Psychoeducation, Recreational therapy.   Physician Treatment Plan for Primary Diagnosis: <principal problem not specified> Long Term Goal(s): Improvement in symptoms so as ready for discharge Improvement in symptoms so as ready for discharge   Short Term Goals: Ability to identify and develop effective coping behaviors will improve Ability to maintain clinical measurements within normal limits will improve Compliance with prescribed medications will improve Ability to identify and develop effective coping behaviors will improve Ability to maintain clinical measurements within normal limits will improve Compliance with prescribed medications will improve  Medication Management: Evaluate patient's response, side effects, and tolerance of medication regimen.  Therapeutic Interventions: 1 to 1 sessions, Unit Group sessions and Medication administration.  Evaluation of Outcomes: Not Progressing  Physician Treatment Plan for Secondary  Diagnosis: Active Problems:   Brief psychotic disorder ([place])   Schizophreniform disorder ([place])  Long Term Goal(s): Improvement in symptoms so as ready for discharge Improvement in symptoms so as ready for discharge   Short Term Goals: Ability to identify and develop effective coping behaviors will improve Ability to maintain clinical measurements within normal limits will improve Compliance with prescribed medications will improve Ability to  identify and develop effective coping behaviors will improve Ability to maintain clinical measurements within normal limits will improve Compliance with prescribed medications will improve     Medication Management: Evaluate patient's response, side effects, and tolerance of medication regimen.  Therapeutic Interventions: 1 to 1 sessions, Unit Group sessions and Medication administration.  Evaluation of Outcomes: Not Progressing   RN Treatment Plan for Primary Diagnosis: <principal problem not specified> Long Term Goal(s): Knowledge of disease and therapeutic regimen to maintain health will improve  Short Term Goals: Ability to participate in decision making will improve, Ability to verbalize feelings will improve, Ability to disclose and discuss suicidal ideas, Ability to identify and develop effective coping behaviors will improve and Compliance with prescribed medications will improve  Medication Management: RN will administer medications as ordered by provider, will assess and evaluate patient's response and provide education to patient for prescribed medication. RN will report any adverse and/or side effects to prescribing provider.  Therapeutic Interventions: 1 on 1 counseling sessions, Psychoeducation, Medication administration, Evaluate responses to treatment, Monitor vital signs and CBGs as ordered, Perform/monitor CIWA, COWS, AIMS and Fall Risk screenings as ordered, Perform wound care treatments as ordered.  Evaluation of Outcomes: Not Progressing   LCSW Treatment Plan for Primary Diagnosis: <principal problem not specified> Long Term Goal(s): Safe transition to appropriate next level of care at discharge, Engage patient in therapeutic group addressing interpersonal concerns.  Short Term Goals: Engage patient in aftercare planning with referrals and resources and Increase skills for wellness and recovery  Therapeutic Interventions: Assess for all discharge needs, 1 to 1 time  with Social worker, Explore available resources and support systems, Assess for adequacy in community support network, Educate family and significant other(s) on suicide prevention, Complete Psychosocial Assessment, Interpersonal group therapy.  Evaluation of Outcomes: Not Progressing   Progress in Treatment: Attending groups: No. Participating in groups: No. Taking medication as prescribed: Yes. Toleration medication: Yes. Family/Significant other contact made: No, will contact:  pt's father  Patient understands diagnosis: No. Discussing patient identified problems/goals with staff: Yes. Medical problems stabilized or resolved: Yes. Denies suicidal/homicidal ideation: Yes. Issues/concerns per patient self-inventory: No. Other:   New problem(s) identified: No, Describe:  none   New Short Term/Long Term Goal(s): Medication stabilization, elimination of SI thoughts, and development of a comprehensive mental wellness plan.   Patient Goals:  "to get better"  Discharge Plan or Barriers:  Patient is wanting to go to rehab at either [facility] or [facility] or follow up outpatient with substance abuse treatment and therapy.   Reason for Continuation of Hospitalization: Aggression Anxiety Delusions  Depression Medication stabilization  Estimated Length of Stay: 3-5 days   Attendees: Patient: 06/10/2019   Physician: Dr. Parke Poisson, MD 06/10/2019   Nursing:  06/10/2019   RN Care Manager: 06/10/2019   Social Worker: Ovidio Kin, MSW intern  06/10/2019   Recreational Therapist:  06/10/2019   Other: Sherlene Shams, [place] student  06/10/2019   Other:  06/10/2019   Other: 06/10/2019     Scribe for Treatment Team: Billey Chang, [place] Work 06/10/2019 9:27 AM

## 2019-06-10 NOTE — [facility] Suicide Risk Assessment (Cosign Needed)
[facility] INPATIENT:  Family/Significant Other Suicide Prevention Education  Suicide Prevention Education:  Contact Attempts: w father Yaneth Fairbairn [phone] has been identified by the patient as the family member/significant other with whom the patient will be residing, and identified as the person(s) who will aid the patient in the event of a mental health crisis.  With written consent from the patient, two attempts were made to provide suicide prevention education, prior to and/or following the patient's discharge.  We were unsuccessful in providing suicide prevention education.  A suicide education pamphlet was given to the patient to share with family/significant other.  Date and time of first attempt: 2/19 @ 1:40pm  Date and time of second attempt:  Reynold Bowen 06/10/2019, 1:45 PM

## 2019-06-10 NOTE — Progress Notes (Signed)
Pt was up in the room pacing while her roommate was trying to sleep

## 2019-06-10 NOTE — Progress Notes (Signed)
Patient observed very anxious and pacing hallway. She attempted at attend AA meeting and was observed pacing during the group and had to be redirected twice during meeting. She remains very childlike and tangential in conversation. Safety maintained with 15 min checks.
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
## 2019-06-10 NOTE — Progress Notes (Signed)
Pt was in and out the room disturbing the roommate , putting clothes and blankets in the floor making trip hazards for a high fall risk roommate . Pt was moved to 305 and made no roommate

## 2019-06-10 NOTE — Plan of Care (Signed)
Progress Note  D: pt found in bed; compliant with medication administration. Pt states they slept well last night. From report though pt was active through the night and keeping roommate awake. Pt pt continues to pace today and clogged their toilet up. Pt is hypervigilant with questions and requests but is easily distracted and forgetful of them. Pt seems to have regressed since yesterday and is focused on their father and past trauma. Pt denies si/hi/ah/vh and verbally agrees to approach staff if these become apparent or before harming themself/others while at bhh.  A: Pt provided support and encouragement. Pt given medication per protocol and standing orders. Q36m safety checks implemented and continued.  R: Pt safe on the unit. Will continue to monitor.  Pt progressing in the following metrics  Problem: Activity: Goal: Sleeping patterns will improve Outcome: Not Progressing   Problem: Coping: Goal: Ability to verbalize frustrations and anger appropriately will improve Outcome: Not Progressing Goal: Ability to demonstrate self-control will improve Outcome: Not Progressing   Problem: Health Behavior/Discharge Planning: Goal: Compliance with treatment plan for underlying cause of condition will improve Outcome: Progressing

## 2019-06-10 NOTE — [facility] Group Notes (Signed)
LCSW Group Therapy Note  06/10/2019    10:00-11:00am   Type of Therapy and Topic:  Group Therapy: Early Messages Received About Anger  Participation Level:  Did Not Attend   Description of Group:   In this group, patients shared and discussed the early messages received in their lives about anger through parental or other adult modeling, teaching, repression, punishment, violence, and more.  Participants identified how those childhood lessons influence even now how they usually or often react when angered.  The group discussed that anger is a secondary emotion and what may be the underlying emotional themes that come out through anger outbursts or that are ignored through anger suppression.  Finally, as a group there was a conversation about the workbook's quote that "There is nothing wrong with anger; it is just a sign something needs to change."     Therapeutic Goals: 1. Patients will identify one or more childhood message about anger that they received and how it was taught to them. 2. Patients will discuss how these childhood experiences have influenced and continue to influence their own expression or repression of anger even today. 3. Patients will explore possible primary emotions that tend to fuel their secondary emotion of anger. 4. Patients will learn that anger itself is normal and cannot be eliminated, and that healthier coping skills can assist with resolving conflict rather than worsening situations.  Summary of Patient Progress:  The patient did attend   Therapeutic Modalities:   Cognitive Behavioral Therapy Motivation Interviewing  Lynnell Chad  .
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
## 2019-06-10 NOTE — Progress Notes (Signed)
Pt has been up and down much of the night waking her roommate up and disturbing her. Pt given Zyprexa per [facility]

## 2019-06-10 NOTE — Progress Notes (Signed)
According to another pt, this pt tried to walk into the other's room and kept asking the other to pace with her. The other pat came to the nurses station to let us know.   Earlyne Iba, MSW intern

## 2019-06-10 NOTE — Progress Notes (Signed)
   06/10/19 2036  COVID-19 Daily Checkoff  Have you had a fever (temp > 37.80C/100F)  in the past 24 hours?  No  If you have had runny nose, nasal congestion, sneezing in the past 24 hours, has it worsened? No  COVID-19 EXPOSURE  Have you traveled outside the state in the past 14 days? No  Have you been in contact with someone with a confirmed diagnosis of COVID-19 or PUI in the past 14 days without wearing appropriate PPE? No  Have you been living in the same home as a person with confirmed diagnosis of COVID-19 or a PUI (household contact)? No  Have you been diagnosed with COVID-19? No

## 2019-06-10 NOTE — Progress Notes (Signed)
Pt up rattling her bags stating she was restless , making noise in the room. Pt out the room sitting in the hallway

## 2019-06-11 MED ORDER — HYDROXYZINE HCL 25 MG PO TABS
25.0000 mg | ORAL_TABLET | Freq: Four times a day (QID) | ORAL | Status: DC | PRN
  Administered 2019-06-11: 25 mg via ORAL
  Filled 2019-06-11: qty 1

## 2019-06-11 MED ORDER — LORAZEPAM 0.5 MG PO TABS
0.5000 mg | ORAL_TABLET | Freq: Four times a day (QID) | ORAL | Status: DC | PRN
  Administered 2019-06-12 – 2019-06-14 (×4): 0.5 mg via ORAL
  Filled 2019-06-11 (×4): qty 1

## 2019-06-11 MED ORDER — BENZTROPINE MESYLATE 1 MG PO TABS
1.0000 mg | ORAL_TABLET | Freq: Two times a day (BID) | ORAL | Status: DC | PRN
  Filled 2019-06-11: qty 1

## 2019-06-11 MED ORDER — BUSPIRONE HCL 10 MG PO TABS
10.0000 mg | ORAL_TABLET | Freq: Two times a day (BID) | ORAL | Status: DC
  Administered 2019-06-12 – 2019-06-14 (×5): 10 mg via ORAL
  Filled 2019-06-11 (×9): qty 1

## 2019-06-11 MED ORDER — GABAPENTIN 100 MG PO CAPS
100.0000 mg | ORAL_CAPSULE | Freq: Three times a day (TID) | ORAL | Status: DC
  Administered 2019-06-11: 100 mg via ORAL
  Filled 2019-06-11 (×7): qty 1

## 2019-06-11 MED ORDER — TRAZODONE HCL 50 MG PO TABS
50.0000 mg | ORAL_TABLET | Freq: Every evening | ORAL | Status: DC | PRN
  Administered 2019-06-12 – 2019-06-13 (×2): 50 mg via ORAL
  Filled 2019-06-11 (×3): qty 1

## 2019-06-11 MED ORDER — TRAZODONE HCL 100 MG PO TABS
200.0000 mg | ORAL_TABLET | Freq: Every day | ORAL | Status: DC
  Filled 2019-06-11 (×2): qty 2

## 2019-06-11 NOTE — Progress Notes (Signed)
D Alert and Oriented Presents with disorganized thought process and preoccupied content. Pt Pacing in the dayroom. Prn atarax given with little effect. Pt teary very emotional and c/o feeling stiff and stating "I feel like I might have a seizure and my fingers and my hands are stiff I cannot feel my fingers" Provider called and Prn cogentin order given.   Zyprexa for anxiety also given and effective.   A Scheduled medications administered per Provider order. Support and encouragement provided all day. Routine safety checks conducted every 15 minutes. Patient notified to inform staff with problems or concerns.  R. Pt calm playing with a stress ball in the day room. Patient contracts for safety at this time. Will continue to monitor.

## 2019-06-11 NOTE — [facility] Group Notes (Signed)
  [facility] LCSW Group Therapy Note  Date/Time:  06/11/2019 11:15AM-12:00PM  Type of Therapy and Topic:  Group Therapy:  Feelings About Hospitalization  Participation Level:  Did Not Attend   Description of Group This process group involved patients discussing their feelings related to being hospitalized, as well as the benefits they see to being in the hospital.  These feelings and benefits were itemized.  The group then brainstormed specific ways in which they could seek those same benefits when they discharge and return home.  Therapeutic Goals 1. Patient will identify and describe positive and negative feelings related to hospitalization 2. Patient will verbalize benefits of hospitalization to themselves personally 3. Patients will brainstorm together ways they can obtain similar benefits in the outpatient setting, identify barriers to wellness and possible solutions  Summary of Patient Progress: Did Not Attend - was invited both individually by [facility] and by overhead announcement, chose not to attend. Pt did continue to pace back and forth outside of group room, knocking on door, and made efforts to distract other group members.   Cyril Loosen, LCSWA 06/11/2019, 12:31 PM

## 2019-06-11 NOTE — Progress Notes (Addendum)
[facility] MD Progress Note  06/11/2019 9:00 AM Julie Gentry  MRN:  [id number] Subjective:  "I'm fine."  Julie Gentry continues to pace in hallway and her room. She presents with disorganized thoughts. She reports mood is "fine" but does admit to anxiety. She is pacing back and forth throughout assessment, unfolding and refolding her clothes. She was started on perphenazine yesterday. Per review of prior notes she has been pacing throughout admission. She denies urges to move but states, "I enjoy walking." She appears preoccupied and only answers questions intermittently. Per nursing report she shared bizarre thought content this morning about her dreams overnight. She denies AVH. She reports SI but denies plan or intent. Denies HI. Trazodone was increased last night, with sleep improved to 5.5 hours overnight. She has been accepted to [facility] pending bed availability and continues to agree to discharge to [place].   From admission H&P: This is the second psychiatric admission in 2 weeks time, the first at our facility for this 24 year old single female patient who was [facility] from [facility], but brought directly to [facility] by her father concerned about continued psychotic symptoms. The patient presents to [place] under petition for involuntary commitment, which describes substance abuse, bizarre behavior, nonsensical statements, and suicidal thoughts.  She expressed to our assessment team she plan to "drink bleach" and reported ongoing visual hallucinations of seeing demons, and auditory hallucinations urging her to harm herself.  Principal Problem: <principal problem not specified> Diagnosis: Active Problems:   Brief psychotic disorder ([place])   Schizophreniform disorder ([place])  Total Time spent with patient: 15 minutes  Past Psychiatric History: See admission H&P  Past Medical History: History reviewed. No pertinent past medical history. History reviewed. No pertinent surgical  history. Family History: History reviewed. No pertinent family history. Family Psychiatric  History: See admission H&P Social History:  Social History   Substance and Sexual Activity  Alcohol Use Not Currently     Social History   Substance and Sexual Activity  Drug Use Yes  . Types: Marijuana   Comment: occasionally    Social History   Socioeconomic History  . Marital status: Single    Spouse name: Not on file  . Number of children: Not on file  . Years of education: 27  . Highest education level: High school graduate  Occupational History  . Occupation: Unemployed  Tobacco Use  . Smoking status: Current Some Day Smoker    Packs/day: 1.00    Types: Cigarettes  . Smokeless tobacco: Never Used  Substance and Sexual Activity  . Alcohol use: Not Currently  . Drug use: Yes    Types: Marijuana    Comment: occasionally  . Sexual activity: Yes    Birth control/protection: None  Other Topics Concern  . Not on file  Social History Narrative   Pt is unemployed; lives in [place] with her father   Social Determinants of Health   Financial Resource Strain:   . Difficulty of Paying Living Expenses: Not on file  Food Insecurity:   . Worried About Charity fundraiser in the Last Year: Not on file  . Ran Out of Food in the Last Year: Not on file  Transportation Needs:   . Lack of Transportation (Medical): Not on file  . Lack of Transportation (Non-Medical): Not on file  Physical Activity:   . Days of Exercise per Week: Not on file  . Minutes of Exercise per Session: Not on file  Stress:   . Feeling of  Stress : Not on file  Social Connections:   . Frequency of Communication with Friends and Family: Not on file  . Frequency of Social Gatherings with Friends and Family: Not on file  . Attends Religious Services: Not on file  . Active Member of Clubs or Organizations: Not on file  . Attends Banker Meetings: Not on file  . Marital Status: Not on file    Additional Social History:    Pain Medications: See MAR Prescriptions: See MAR Over the Counter: See MAR History of alcohol / drug use?: Yes Longest period of sobriety (when/how long): UTA Negative Consequences of Use: Work / School Name of Substance 1: THC 1 - Frequency: UTA Name of Substance 2: Amphetamines 2 - Frequency: UTA Name of Substance 3: benzos 3 - Frequency: UTA 3 - Last Use / Amount: UTA              Sleep: Fair  Appetite:  Fair  Current Medications: Current Facility-Administered Medications  Medication Dose Route Frequency Provider Last Rate Last Admin  . acetaminophen (TYLENOL) tablet 650 mg  650 mg Oral Q6H PRN Anike, Adaku C, NP   650 mg at 06/09/19 2059  . busPIRone (BUSPAR) tablet 15 mg  15 mg Oral TID Malvin Johns, MD   15 mg at 06/11/19 0816  . feeding supplement (ENSURE ENLIVE) (ENSURE ENLIVE) liquid 237 mL  237 mL Oral BID BM Malvin Johns, MD   237 mL at 06/10/19 1546  . FLUoxetine (PROZAC) capsule 10 mg  10 mg Oral Daily Malvin Johns, MD   10 mg at 06/11/19 0815  . hydrOXYzine (ATARAX/VISTARIL) tablet 25 mg  25 mg Oral TID PRN Anike, Adaku C, NP   25 mg at 06/10/19 2102  . nicotine (NICODERM CQ - dosed in mg/24 hours) patch 21 mg  21 mg Transdermal Daily Malvin Johns, MD   21 mg at 06/11/19 0817  . OLANZapine zydis (ZYPREXA) disintegrating tablet 10 mg  10 mg Oral Q8H PRN Antonieta Pert, MD   10 mg at 06/10/19 1101  . omega-3 acid ethyl esters (LOVAZA) capsule 1 g  1 g Oral BID Malvin Johns, MD   1 g at 06/11/19 0815  . perphenazine (TRILAFON) tablet 4 mg  4 mg Oral TID Malvin Johns, MD   4 mg at 06/11/19 0839  . pneumococcal 23 valent vaccine (PNEUMOVAX-23) injection 0.5 mL  0.5 mL Intramuscular Tomorrow-1000 Malvin Johns, MD      . prenatal multivitamin tablet 1 tablet  1 tablet Oral Daily Malvin Johns, MD   1 tablet at 06/11/19 [phone]  . traZODone (DESYREL) tablet 150 mg  150 mg Oral QHS Malvin Johns, MD   150 mg at 06/10/19 2102    Lab  Results: No results found for this or any previous visit (from the past 48 hour(s)).  Blood Alcohol level:  Lab Results  Component Value Date   ETH <10 03/21/2018    Metabolic Disorder Labs: Lab Results  Component Value Date   HGBA1C 5.1 06/01/2019   MPG 100 06/01/2019   Lab Results  Component Value Date   PROLACTIN 12.7 06/01/2019   Lab Results  Component Value Date   CHOL 178 06/01/2019   TRIG 59 06/01/2019   HDL 54 06/01/2019   CHOLHDL 3.3 06/01/2019   VLDL 12 06/01/2019   LDLCALC 112 (H) 06/01/2019    Physical Findings: AIMS: Facial and Oral Movements Muscles of Facial Expression: None, normal Lips and Perioral Area: None, normal Jaw:  None, normal Tongue: None, normal,Extremity Movements Upper (arms, wrists, hands, fingers): None, normal Lower (legs, knees, ankles, toes): None, normal, Trunk Movements Neck, shoulders, hips: None, normal, Overall Severity Severity of abnormal movements (highest score from questions above): None, normal Incapacitation due to abnormal movements: None, normal Patient's awareness of abnormal movements (rate only patient's report): No Awareness, Dental Status Current problems with teeth and/or dentures?: No Does patient usually wear dentures?: No  CIWA:    COWS:     Musculoskeletal: Strength & Muscle Tone: within normal limits Gait & Station: normal Patient leans: N/A  Psychiatric Specialty Exam: Physical Exam  Nursing note and vitals reviewed. Constitutional: She is oriented to person, place, and time. She appears well-developed and well-nourished.  Respiratory: Effort normal.  Neurological: She is alert and oriented to person, place, and time.    Review of Systems  Constitutional: Negative.   Respiratory: Negative for cough and shortness of breath.   Psychiatric/Behavioral: Positive for agitation and suicidal ideas. Negative for behavioral problems, dysphoric mood, hallucinations, self-injury and sleep disturbance. The  patient is nervous/anxious. The patient is not hyperactive.     Blood pressure (!) 128/91, pulse (!) 108, temperature (!) 97.2 F (36.2 C), resp. rate 16, height 5\' 2"  (1.575 m), weight 47.5 kg, SpO2 99 %.Body mass index is 19.15 kg/m.  General Appearance: Fairly Groomed  Eye Contact:  Minimal  Speech:  Slow  Volume:  Decreased  Mood:  Anxious  Affect:  Congruent  Thought Process:  Disorganized  Orientation:  Full (Time, Place, and Person)  Thought Content:  Tangential  Suicidal Thoughts:  Yes.  without intent/plan  Homicidal Thoughts:  No  Memory:  Immediate;   Fair Recent;   Fair  Judgement:  Fair  Insight:  Lacking  Psychomotor Activity:  Increased  Concentration:  Concentration: Poor and Attention Span: Poor  Recall:  Poor  Fund of Knowledge:  Fair  Language:  Fair  Akathisia:  No  Handed:  Right  AIMS (if indicated):     Assets:  Communication Skills Desire for Improvement Housing Resilience  ADL's:  Intact  Cognition:  WNL  Sleep:  Number of Hours: 5.5     Treatment Plan Summary: Daily contact with patient to assess and evaluate symptoms and progress in treatment and Medication management   Continue inpatient hospitalization.  Start gabapentin 100 mg PO TID for anxiety/restlessness Increase trazodone to 200 mg PO QHS for insomnia Increase Vistaril 25 mg to Q6HR PRN anxiety Continue Trilafon 4 mg PO TID for psychosis Continue Buspar 15 mg PO TID for anxiety Continue Prozac 10 mg PO daily for depression/anxiety Continue Lovaza 1 g PO BID for neuroprotection Continue Zyprexa 10 mg PO Q8HR PRN agitation  Patient will participate in the therapeutic group milieu.  Discharge disposition in progress.   , NP 06/11/2019, 9:00 AM   Patient reported some EPS/some jaw stiffness  to RN staff . With staff present I examined patient. Currently some mild cog-wheeling noted but no rigidity. No mouth or mandibular compromise noted at this time and able to  communicate /speak without difficulties . Pacing, possible akathisia was  noted earlier, but currently does not present overtly agitated or pacing/restless. No myoclonus  noted /no diaphoresis  Vitals are stable 125/88, pulse 97, Temp 97.7 , pulse ox 99 at room air. EKG was done earlier today - NSR .HR 99, QTc 469.    For now continue Trilafon at 4 mgrs TID, and Zyprexa 10 mgrs Q 8 hours for acute  agitation if needed . Will start Cogentin 1 mgr BID PRN for EPS as needed. Will decrease Trazodone to 50 mgrs QHS PRN for insomnia, decrease Buspar to 10 mgrs BID. Will discontinue Vistaril PRN and start Ativan 0.5 mgrs Q 6 hours PRN.  Recheck CBC, BMP in AM.  F Rianne Degraaf MD

## 2019-06-11 NOTE — [facility] Group Notes (Signed)
Adult Psychoeducational Group Note  Date:  06/11/2019 Time:  12:16 PM  Group Topic/Focus:  Self Esteem Action Plan:   The focus of this group is to help patients create a plan to continue to build self-esteem after discharge.  Participation Level:  Minimal  Participation Quality:  Intrusive,   Affect:  Flat  Cognitive:  Delusional  Insight: Lacking  Engagement in Group:  Off Topic  Modes of Intervention:  Discussion and Education  Additional Comments:  Pt having trouble sitting. Got up, walked around the room several times, pulled the curtains back and forth. Did however, pay attention to what was being said.  Dione Housekeeper 06/11/2019, 12:16 PM

## 2019-06-11 NOTE — [facility] Group Notes (Signed)
Adult Psychoeducational Group Note  Date:  06/11/2019 Time:  4:26 PM  Group Topic/Focus:  Progressive Relaxation; A group where all members sit quietly and learn deep breathing, along with progressive relaxation of the muscles in the body from the head to the toes.   Participation Level:  Minimal  Participation Quality:  pt was having severe anxiety and left the group room several times. She however, kept trying.  Affect:  Labile  Cognitive:  Delusional  Insight: Lacking  Engagement in Group:  Poor  Modes of Intervention:  Activity and Education  Additional Comments:  Tried several times to come back to the class but was having trouble concentrating and following the directions.  Dione Housekeeper 06/11/2019, 4:26 PM

## 2019-06-12 LAB — CBC WITH DIFFERENTIAL/PLATELET
Abs Immature Granulocytes: 0.02 10*3/uL (ref 0.00–0.07)
Basophils Absolute: 0 10*3/uL (ref 0.0–0.1)
Basophils Relative: 0 %
Eosinophils Absolute: 0.1 10*3/uL (ref 0.0–0.5)
Eosinophils Relative: 2 %
HCT: 36.4 % (ref 36.0–46.0)
Hemoglobin: 11.5 g/dL — ABNORMAL LOW (ref 12.0–15.0)
Immature Granulocytes: 0 %
Lymphocytes Relative: 23 %
Lymphs Abs: 1.2 10*3/uL (ref 0.7–4.0)
MCH: 27.8 pg (ref 26.0–34.0)
MCHC: 31.6 g/dL (ref 30.0–36.0)
MCV: 87.9 fL (ref 80.0–100.0)
Monocytes Absolute: 0.8 10*3/uL (ref 0.1–1.0)
Monocytes Relative: 15 %
Neutro Abs: 3.1 10*3/uL (ref 1.7–7.7)
Neutrophils Relative %: 60 %
Platelets: 285 10*3/uL (ref 150–400)
RBC: 4.14 MIL/uL (ref 3.87–5.11)
RDW: 13.2 % (ref 11.5–15.5)
WBC: 5.2 10*3/uL (ref 4.0–10.5)
nRBC: 0 % (ref 0.0–0.2)

## 2019-06-12 LAB — COMPREHENSIVE METABOLIC PANEL
ALT: 150 U/L — ABNORMAL HIGH (ref 0–44)
AST: 73 U/L — ABNORMAL HIGH (ref 15–41)
Albumin: 4 g/dL (ref 3.5–5.0)
Alkaline Phosphatase: 57 U/L (ref 38–126)
Anion gap: 7 (ref 5–15)
BUN: 17 mg/dL (ref 6–20)
CO2: 25 mmol/L (ref 22–32)
Calcium: 9 mg/dL (ref 8.9–10.3)
Chloride: 106 mmol/L (ref 98–111)
Creatinine, Ser: 0.58 mg/dL (ref 0.44–1.00)
GFR calc Af Amer: >60 mL/min (ref >60)
GFR calc non Af Amer: >60 mL/min (ref >60)
Glucose, Bld: 75 mg/dL (ref 70–99)
Potassium: 4 mmol/L (ref 3.5–5.1)
Sodium: 138 mmol/L (ref 135–145)
Total Bilirubin: 0.6 mg/dL (ref 0.3–1.2)
Total Protein: 7.2 g/dL (ref 6.5–8.1)

## 2019-06-12 NOTE — [facility] Suicide Risk Assessment (Signed)
[facility] INPATIENT:  Family/Significant Other Suicide Prevention Education  Suicide Prevention Education:  Education Completed; Father Julie Gentry [phone],  (name of family member/significant other) has been identified by the patient as the family member/significant other with whom the patient will be residing, and identified as the person(s) who will aid the patient in the event of a mental health crisis (suicidal ideations/suicide attempt).  With written consent from the patient, the family member/significant other has been provided the following suicide prevention education, prior to the and/or following the discharge of the patient.  All guns have been removed from the home.  Father is very concerned that patient should go to rehab before being discharged, thinks she should go under [facility].  He knows she likes [facility] and they have said they are open to a referral from [place].  He was told about the acceptance at [facility].  He is available to talk to and willing to share any information.  The suicide prevention education provided includes the following:  Suicide risk factors  Suicide prevention and interventions  National Suicide Hotline telephone number  [facility] assessment telephone number  [facility] Emergency Assistance 911  [facility] and/or Residential Mobile Crisis Unit telephone number  Request made of family/significant other to:  Remove weapons (e.g., guns, rifles, knives), all items previously/currently identified as safety concern.    Remove drugs/medications (over-the-counter, prescriptions, illicit drugs), all items previously/currently identified as a safety concern.  The family member/significant other verbalizes understanding of the suicide prevention education information provided.  The family member/significant other agrees to remove the items of safety concern listed above.  Julie Gentry 06/12/2019, 4:56 PM

## 2019-06-12 NOTE — Progress Notes (Signed)
Patient has been asleep since shift change and woke up around 4:30. She starting pacing the hallway and constantly asking for toiletry items and linen. She had to be limited because she continued to ask for the same items. She paces in front of the nursing station to her room until she gets medications. Safety maintained on unit with 15 min checks.

## 2019-06-12 NOTE — Progress Notes (Signed)
   06/12/19 1950  COVID-19 Daily Checkoff  Have you had a fever (temp > 37.80C/100F)  in the past 24 hours?  No  If you have had runny nose, nasal congestion, sneezing in the past 24 hours, has it worsened? No  COVID-19 EXPOSURE  Have you traveled outside the state in the past 14 days? No  Have you been in contact with someone with a confirmed diagnosis of COVID-19 or PUI in the past 14 days without wearing appropriate PPE? No  Have you been living in the same home as a person with confirmed diagnosis of COVID-19 or a PUI (household contact)? No  Have you been diagnosed with COVID-19? No

## 2019-06-12 NOTE — [facility] Suicide Risk Assessment (Signed)
[facility] INPATIENT:  Family/Significant Other Suicide Prevention Education  Suicide Prevention Education:  Contact Attempts: father Julie Gentry [phone], (name of family member/significant other) has been identified by the patient as the family member/significant other with whom the patient will be residing, and identified as the person(s) who will aid the patient in the event of a mental health crisis.  With written consent from the patient, two attempts were made to provide suicide prevention education, prior to and/or following the patient's discharge.  We were unsuccessful in providing suicide prevention education.  A suicide education pamphlet was given to the patient to share with family/significant other.  Date and time of first attempt:  2/19  /1:40 Date and time of second attempt:  06/12/2019 /  8:41 AM   HIPAA-compliant VM left  Julie Gentry 06/12/2019, 8:41 AM

## 2019-06-12 NOTE — Progress Notes (Signed)
[facility] MD Progress Note  06/12/2019 11:09 AM Julie Gentry  MRN:  [id number] Subjective:  "I'm ok."  [name] found folding clothes in her room. She remains disorganized and labile at times but appears calmer on assessment today. She continues to pace on assessment. She reports anxiety is improved from yesterday. She states that she became anxious earlier this morning because "I didn't know if I was at [facility] or not and if I was going to work or jail." She is oriented x3 at this time. Her sheets and blankets are on the bathroom floor and when asked about this patient reports, "I like to meditate in there to calm down." She denies SI/HI/AVH.  Patient was assessed by Dr. Jama Flavors yesterday afternoon due to patient reports of EPS/jaw stiffness, with medications adjusted at that time. Patient denies muscle stiffness at this time. No cogwheeling. Patient continues to pace frequently but denies restlessness. VSS.  From admission H&P: This is the second psychiatric admission in 2 weeks time, the first at our facility for this 24 year old single female patient who was released from [facility], but brought directly to [facility] by her father concerned about continued psychotic symptoms. The patient presents to [place] under petition for involuntary commitment, which describes substance abuse, bizarre behavior, nonsensical statements, and suicidal thoughts. She expressed to our assessment team she plan to "drink bleach" and reported ongoing visual hallucinations of seeing demons, and auditory hallucinations urging her to harm herself.  Principal Problem: <principal problem not specified> Diagnosis: Active Problems:   Brief psychotic disorder (HCC)   Schizophreniform disorder (HCC)  Total Time spent with patient: 15 minutes  Past Psychiatric History: See admission H&P  Past Medical History: History reviewed. No pertinent past medical history. History reviewed. No pertinent surgical  history. Family History: History reviewed. No pertinent family history. Family Psychiatric  History: See admission H&P Social History:  Social History   Substance and Sexual Activity  Alcohol Use Not Currently     Social History   Substance and Sexual Activity  Drug Use Yes  . Types: Marijuana   Comment: occasionally    Social History   Socioeconomic History  . Marital status: Single    Spouse name: Not on file  . Number of children: Not on file  . Years of education: 71  . Highest education level: High school graduate  Occupational History  . Occupation: Unemployed  Tobacco Use  . Smoking status: Current Some Day Smoker    Packs/day: 1.00    Types: Cigarettes  . Smokeless tobacco: Never Used  Substance and Sexual Activity  . Alcohol use: Not Currently  . Drug use: Yes    Types: Marijuana    Comment: occasionally  . Sexual activity: Yes    Birth control/protection: None  Other Topics Concern  . Not on file  Social History Narrative   Pt is unemployed; lives in [place] with her father   Social Determinants of Health   Financial Resource Strain:   . Difficulty of Paying Living Expenses: Not on file  Food Insecurity:   . Worried About Programme researcher, broadcasting/film/video in the Last Year: Not on file  . Ran Out of Food in the Last Year: Not on file  Transportation Needs:   . Lack of Transportation (Medical): Not on file  . Lack of Transportation (Non-Medical): Not on file  Physical Activity:   . Days of Exercise per Week: Not on file  . Minutes of Exercise per Session: Not on file  Stress:   . Feeling of Stress : Not on file  Social Connections:   . Frequency of Communication with Friends and Family: Not on file  . Frequency of Social Gatherings with Friends and Family: Not on file  . Attends Religious Services: Not on file  . Active Member of Clubs or Organizations: Not on file  . Attends Banker Meetings: Not on file  . Marital Status: Not on file    Additional Social History:    Pain Medications: See MAR Prescriptions: See MAR Over the Counter: See MAR History of alcohol / drug use?: Yes Longest period of sobriety (when/how long): UTA Negative Consequences of Use: Work / School Name of Substance 1: THC 1 - Frequency: UTA Name of Substance 2: Amphetamines 2 - Frequency: UTA Name of Substance 3: benzos 3 - Frequency: UTA 3 - Last Use / Amount: UTA              Sleep: Fair  Appetite:  Fair  Current Medications: Current Facility-Administered Medications  Medication Dose Route Frequency Provider Last Rate Last Admin  . acetaminophen (TYLENOL) tablet 650 mg  650 mg Oral Q6H PRN Anike, Adaku C, NP   650 mg at 06/09/19 2059  . benztropine (COGENTIN) tablet 1 mg  1 mg Oral BID PRN Cobos, Rockey Situ, MD      . busPIRone (BUSPAR) tablet 10 mg  10 mg Oral BID Cobos, Rockey Situ, MD   10 mg at 06/12/19 0737  . feeding supplement (ENSURE ENLIVE) (ENSURE ENLIVE) liquid 237 mL  237 mL Oral BID BM Malvin Johns, MD   237 mL at 06/12/19 0816  . FLUoxetine (PROZAC) capsule 10 mg  10 mg Oral Daily Malvin Johns, MD   10 mg at 06/12/19 0737  . LORazepam (ATIVAN) tablet 0.5 mg  0.5 mg Oral Q6H PRN Cobos, Rockey Situ, MD   0.5 mg at 06/12/19 0837  . nicotine (NICODERM CQ - dosed in mg/24 hours) patch 21 mg  21 mg Transdermal Daily Malvin Johns, MD   21 mg at 06/12/19 0757  . OLANZapine zydis (ZYPREXA) disintegrating tablet 10 mg  10 mg Oral Q8H PRN Antonieta Pert, MD   10 mg at 06/12/19 0519  . omega-3 acid ethyl esters (LOVAZA) capsule 1 g  1 g Oral BID Malvin Johns, MD   1 g at 06/12/19 [phone]  . perphenazine (TRILAFON) tablet 4 mg  4 mg Oral TID Malvin Johns, MD   4 mg at 06/12/19 5366  . pneumococcal 23 valent vaccine (PNEUMOVAX-23) injection 0.5 mL  0.5 mL Intramuscular Tomorrow-1000 Malvin Johns, MD      . prenatal multivitamin tablet 1 tablet  1 tablet Oral Daily Malvin Johns, MD   1 tablet at 06/12/19 [phone]  . traZODone (DESYREL)  tablet 50 mg  50 mg Oral QHS PRN Cobos, Rockey Situ, MD        Lab Results:  Results for orders placed or performed during the hospital encounter of 05/31/19 (from the past 48 hour(s))  CBC with Differential/Platelet     Status: Abnormal   Collection Time: 06/12/19  6:23 AM  Result Value Ref Range   WBC 5.2 4.0 - 10.5 K/uL   RBC 4.14 3.87 - 5.11 MIL/uL   Hemoglobin 11.5 (L) 12.0 - 15.0 g/dL   HCT 47.4 25.9 - 56.3 %   MCV 87.9 80.0 - 100.0 fL   MCH 27.8 26.0 - 34.0 pg   MCHC 31.6 30.0 - 36.0 g/dL  RDW 13.2 11.5 - 15.5 %   Platelets 285 150 - 400 K/uL   nRBC 0.0 0.0 - 0.2 %   Neutrophils Relative % 60 %   Neutro Abs 3.1 1.7 - 7.7 K/uL   Lymphocytes Relative 23 %   Lymphs Abs 1.2 0.7 - 4.0 K/uL   Monocytes Relative 15 %   Monocytes Absolute 0.8 0.1 - 1.0 K/uL   Eosinophils Relative 2 %   Eosinophils Absolute 0.1 0.0 - 0.5 K/uL   Basophils Relative 0 %   Basophils Absolute 0.0 0.0 - 0.1 K/uL   Immature Granulocytes 0 %   Abs Immature Granulocytes 0.02 0.00 - 0.07 K/uL    Comment: Performed at [facility], [place] [address] [place], [place] [address]  Comprehensive metabolic panel     Status: Abnormal   Collection Time: 06/12/19  6:23 AM  Result Value Ref Range   Sodium 138 135 - 145 mmol/L   Potassium 4.0 3.5 - 5.1 mmol/L   Chloride 106 98 - 111 mmol/L   CO2 25 22 - 32 mmol/L   Glucose, Bld 75 70 - 99 mg/dL   BUN 17 6 - 20 mg/dL   Creatinine, Ser 0.58 0.44 - 1.00 mg/dL   Calcium 9.0 8.9 - 10.3 mg/dL   Total Protein 7.2 6.5 - 8.1 g/dL   Albumin 4.0 3.5 - 5.0 g/dL   AST 73 (H) 15 - 41 U/L   ALT 150 (H) 0 - 44 U/L   Alkaline Phosphatase 57 38 - 126 U/L   Total Bilirubin 0.6 0.3 - 1.2 mg/dL   GFR calc non Af Amer >60 >60 mL/min   GFR calc Af Amer >60 >60 mL/min   Anion gap 7 5 - 15    Comment: Performed at [facility], [place] [address], [place], [place] [address]    Blood Alcohol level:  Lab Results  Component Value Date   ETH <10  37/01/6268    Metabolic Disorder Labs: Lab Results  Component Value Date   HGBA1C 5.1 06/01/2019   MPG 100 06/01/2019   Lab Results  Component Value Date   PROLACTIN 12.7 06/01/2019   Lab Results  Component Value Date   CHOL 178 06/01/2019   TRIG 59 06/01/2019   HDL 54 06/01/2019   CHOLHDL 3.3 06/01/2019   VLDL 12 06/01/2019   LDLCALC 112 (H) 06/01/2019    Physical Findings: AIMS: Facial and Oral Movements Muscles of Facial Expression: None, normal Lips and Perioral Area: None, normal Jaw: None, normal Tongue: None, normal,Extremity Movements Upper (arms, wrists, hands, fingers): None, normal Lower (legs, knees, ankles, toes): None, normal, Trunk Movements Neck, shoulders, hips: None, normal, Overall Severity Severity of abnormal movements (highest score from questions above): None, normal Incapacitation due to abnormal movements: None, normal Patient's awareness of abnormal movements (rate only patient's report): No Awareness, Dental Status Current problems with teeth and/or dentures?: No Does patient usually wear dentures?: No  CIWA:    COWS:     Musculoskeletal: Strength & Muscle Tone: within normal limits Gait & Station: normal Patient leans: N/A  Psychiatric Specialty Exam: Physical Exam  Nursing note and vitals reviewed. Constitutional: She is oriented to person, place, and time. She appears well-developed and well-nourished.  Cardiovascular: Normal rate.  Respiratory: Effort normal.  Neurological: She is alert and oriented to person, place, and time.    Review of Systems  Constitutional: Negative.   Respiratory: Negative for cough and shortness of breath.   Psychiatric/Behavioral: Negative  for agitation, behavioral problems, dysphoric mood, hallucinations, self-injury, sleep disturbance and suicidal ideas. The patient is nervous/anxious. The patient is not hyperactive.     Blood pressure 119/76, pulse (!) 102, temperature (!) 97.5 F (36.4 C),  temperature source Oral, resp. rate 16, height 5\' 2"  (1.575 m), weight 47.5 kg, SpO2 99 %.Body mass index is 19.15 kg/m.  General Appearance: Casual  Eye Contact:  Minimal  Speech:  Pressured  Volume:  Normal  Mood:  Anxious  Affect:  Congruent  Thought Process:  Descriptions of Associations: Tangential  Orientation:  Full (Time, Place, and Person)  Thought Content:  Tangential  Suicidal Thoughts:  No  Homicidal Thoughts:  No  Memory:  Immediate;   Fair Recent;   Fair  Judgement:  Fair  Insight:  Lacking  Psychomotor Activity:  Increased  Concentration:  Concentration: Fair and Attention Span: Poor  Recall:  of Knowledge:  Fair  Language:  Fair  Akathisia:  No  Handed:  Right  AIMS (if indicated):     Assets:  Housing Leisure Time Resilience  ADL's:  Intact  Cognition:  WNL  Sleep:  Number of Hours: 4.5     Treatment Plan Summary: Daily contact with patient to assess and evaluate symptoms and progress in treatment and Medication management   Continue inpatient hospitalization.  Continue Trilafon 4 mg PO TID for psychosis Continue Cogentin 1 mg PO BID PRN tremors Continue Buspar 10 mg PO BID for anxiety Continue Prozac 10 mg PO daily for depression/anxiety Continue Ativan 0.5 mg PO Q6HR PRN anxiety Continue Zyprexa 10 mg PO Q8HR PRN agitation Continue Lovaza 1 g PO BID for neuroprotection Continue trazodone 50 mg PO QHS PRN insomnia  Patient will participate in the therapeutic group milieu.  Discharge disposition in progress.   [employer], NP 06/12/2019, 11:09 AM

## 2019-06-12 NOTE — Progress Notes (Signed)
Patient continues to pace the hallway and does not sit long  When in the dayroom. Her conversation is fragmented and tangential. She did report talking to her mother on today. Support given and safety maintained with 15 min checks.

## 2019-06-12 NOTE — Progress Notes (Signed)
   06/12/19 0520  COVID-19 Daily Checkoff  Have you had a fever (temp > 37.80C/100F)  in the past 24 hours?  No  If you have had runny nose, nasal congestion, sneezing in the past 24 hours, has it worsened? No  COVID-19 EXPOSURE  Have you traveled outside the state in the past 14 days? No  Have you been in contact with someone with a confirmed diagnosis of COVID-19 or PUI in the past 14 days without wearing appropriate PPE? No  Have you been living in the same home as a person with confirmed diagnosis of COVID-19 or a PUI (household contact)? No  Have you been diagnosed with COVID-19? No

## 2019-06-12 NOTE — Progress Notes (Signed)
   06/12/19 0450  COVID-19 Daily Checkoff  Have you had a fever (temp > 37.80C/100F)  in the past 24 hours?  No  If you have had runny nose, nasal congestion, sneezing in the past 24 hours, has it worsened? No  COVID-19 EXPOSURE  Have you traveled outside the state in the past 14 days? No  Have you been in contact with someone with a confirmed diagnosis of COVID-19 or PUI in the past 14 days without wearing appropriate PPE? No  Have you been living in the same home as a person with confirmed diagnosis of COVID-19 or a PUI (household contact)? No  Have you been diagnosed with COVID-19? No

## 2019-06-12 NOTE — Progress Notes (Addendum)
   06/12/19 1000  Psych Admission Type (Psych Patients Only)  Admission Status Voluntary  Psychosocial Assessment  Patient Complaints Agitation  Eye Contact Fair  Facial Expression Blank  Affect Irritable  Speech Tangential  Interaction Cautious  Motor Activity Fidgety;Pacing  Appearance/Hygiene Disheveled  Behavior Characteristics Anxious;Restless;Hyperactive  Mood Anxious;Preoccupied;Labile  Thought Process  Coherency Disorganized  Content Blaming others  Delusions UTA  Perception Derealization;Hallucinations  Hallucination Auditory;Visual  Judgment Poor  Confusion Mild  Danger to Self  Current suicidal ideation? Denies  Self-Injurious Behavior No self-injurious ideation or behavior indicators observed or expressed   Agreement Not to Harm Self Yes  Description of Agreement verbal  Danger to Others  Danger to Others None reported or observed    [place] NOVEL CORONAVIRUS (COVID-19) DAILY CHECK-OFF SYMPTOMS - answer yes or no to each - every day NO YES  Have you had a fever in the past 24 hours?  . Fever (Temp > 37.80C / 100F) X   Have you had any of these symptoms in the past 24 hours? . New Cough .  Sore Throat  .  Shortness of Breath .  Difficulty Breathing .  Unexplained Body Aches   X   Have you had any one of these symptoms in the past 24 hours not related to allergies?   . Runny Nose .  Nasal Congestion .  Sneezing   X   If you have had runny nose, nasal congestion, sneezing in the past 24 hours, has it worsened?  X   EXPOSURES - check yes or no X   Have you traveled outside the state in the past 14 days?  X   Have you been in contact with someone with a confirmed diagnosis of COVID-19 or PUI in the past 14 days without wearing appropriate PPE?  X   Have you been living in the same home as a person with confirmed diagnosis of COVID-19 or a PUI (household contact)?    X   Have you been diagnosed with COVID-19?    X              What to do next:  Answered NO to all: Answered YES to anything:   Proceed with unit schedule Follow the [facility] Inpatient Flowsheet.

## 2019-06-12 NOTE — [facility] Group Notes (Signed)
[facility] LCSW Group Therapy Note  Date/Time:  06/12/2019 11:15-12:00PM  Type of Therapy and Topic:  Group Therapy:  Healthy and Unhealthy Supports  Participation Level:  Minimal   Description of Group:  Patients in this group were introduced to the idea of adding a variety of healthy supports to address the various needs in their lives.Patients discussed what additional healthy supports could be helpful in their recovery and wellness after discharge in order to prevent future hospitalizations.   An emphasis was placed on using counselor, doctor, therapy groups, 12-step groups, and problem-specific support groups to expand supports.  They also worked as a group on developing a specific plan for several patients to deal with unhealthy supports through boundary-setting, psychoeducation with loved ones, and even termination of relationships.   Therapeutic Goals:   1)  discuss importance of adding supports to stay well once out of the hospital  2)  compare healthy versus unhealthy supports and identify some examples of each  3)  generate ideas and descriptions of healthy supports that can be added  4)  offer mutual support about how to address unhealthy supports  5)  encourage active participation in and adherence to discharge plan    Summary of Patient Progress:  The patient joined group shortly after opening. Pt actively engaged in discussion of what support means to her, identifying that support means pressure, and this isn't helpful. Pt was not able to identify any supports she has currently, however did share that she feels her family and friends may be able to help her when needed. Pt left group prior to group closing.  Therapeutic Modalities:   Motivational Interviewing Brief Solution-Focused Therapy  Leisa Lenz, LCSWA 06/12/19 1:55PM

## 2019-06-13 MED ORDER — PERPHENAZINE 2 MG PO TABS
6.0000 mg | ORAL_TABLET | Freq: Three times a day (TID) | ORAL | Status: DC
  Administered 2019-06-13 – 2019-06-14 (×3): 6 mg via ORAL
  Filled 2019-06-13 (×9): qty 3

## 2019-06-13 NOTE — Progress Notes (Signed)
Adult Psychoeducational Group Note  Date:  06/13/2019 Time:  2:17 AM  Group Topic/Focus:  Wrap-Up Group:   The focus of this group is to help patients review their daily goal of treatment and discuss progress on daily workbooks.  Participation Level:  Minimal  Participation Quality:  Appropriate  Affect:  Flat, Labile and Lethargic  Cognitive:  Disorganized  Insight: Lacking and Limited  Engagement in Group:  Lacking and Limited  Modes of Intervention:  Discussion  Additional Comments:  Pt stated her goal for today was to focus on her treatment plan. Pt stated she accomplished her goal today. Pt stated her relationship with her family has improved since she was admitted. Pt stated been able to contact her mother today improved her day. Pt rated her overall day a 7 out of 10. Pt stated her appetite was pretty good today. Pt stated her sleep last night was fair. Pt stated she was in no physical pain today.  Pt deny auditory or visual hallucinations. Pt denies thoughts of harming herself or others. Pt stated she would alert staff if anything changes.   Felipa Furnace 06/13/2019, 2:17 AM

## 2019-06-13 NOTE — Progress Notes (Signed)
Pt up pacing talking about she can't sleep "because of all the stress in my head." . Given Ativan 0.5 mg.

## 2019-06-13 NOTE — Progress Notes (Signed)
Recreation Therapy Notes  Date: 2.22.21 Time: 1000 Location: 500 Hall Dayroom  Group Topic: Coping Skills  Goal Area(s) Addresses:  Patients will identify positive coping skills. Patients will identify benefit of using coping skills post d/c.  Behavioral Response: None  Intervention: Worksheet, Music  Activity: Coping Skills A to Z.  Patients were to identify a positive coping skill for each letter of the alphabet.  Education: Pharmacologist, Building control surveyor.   Education Outcome: Acknowledges understanding/In group clarification offered/Needs additional education.   Clinical Observations/Feedback: Pt was quiet but couldn't focus much.  Pt moved to the music for a while before leaving and not returning.    Caroll Rancher, LRT/CTRS         Caroll Rancher A 06/13/2019 11:55 AM

## 2019-06-13 NOTE — [facility] Group Notes (Signed)
LCSW Group Therapy Notes  Type of Therapy and Topic: Group Therapy: Healthy Vs. Unhealthy Coping Strategies  Date and Time: 06/13/2019 @ 1:30pm  Participation Level: [facility] PARTICIPATION LEVEL: Did Not Attend  Description of Group:  In this group, patients will be encouraged to explore their healthy and unhealthy coping strategics. Coping strategies are actions that we take to deal with stress, problems, or uncomfortable emotions in our daily lives. Each patient will be challenged to read some scenarios and discuss the unhealthy and healthy coping strategies within those scenarios. Also, each patient will be challenged to describe current healthy and unhealthy strategies that they use in their own lives and discuss the outcomes and barriers to those strategies. This group will be process-oriented, with patients participating in exploration of their own experiences as well as giving and receiving support and challenge from other group members.  Therapeutic Goals: 1. Patient will identify personal healthy and unhealthy coping strategies. 2. Patient will identify healthy and unhealthy coping strategies, in others, through scenarios.  3. Patient will identify expected outcomes of healthy and unhealthy coping strategies. 4. Patient will identify barriers to using healthy coping strategies.   Summary of Patient Progress:  Patient did not attend group due to being outside doing rec with MHTs. CSW was not aware that patient was outside or that part of the unit was taken outside.   Therapeutic Modalities:  Cognitive Behavioral Therapy Solution Focused Therapy Motivational Interviewing    [place], [place]

## 2019-06-13 NOTE — Progress Notes (Signed)
DAR NOTE: Patient presents with anxious affect and depressed mood.  Denies suicidal thoughts, pain, auditory and visual hallucinations.  Rates depression at 5, hopelessness at 0, and anxiety at 5.  Maintained on routine safety checks.  Medications given as prescribed.  Support and encouragement offered as needed.  Attended group and participated.  States goal for today is "get better."  Patient observed pacing the unit agitated and preoccupied.  Ativan 0.5 mg given with good effect.  Patient is safe on and off the unit.

## 2019-06-13 NOTE — Progress Notes (Addendum)
[facility] MD Progress Note  06/13/2019 7:45 AM Julie Gentry  MRN:  [id number] Subjective:  "I'm doing pretty good"  Julie Gentry is a 24 year old female that is currently hospitalized for psychosis.  The patient is dong okay this morning. She is still desiring to go to rehab where she can get "razors and cigarettes". Her statements are tangential with illogical statements. She denies SI; however, states that the desire comes and goes. She says she has multiple personalities when being Julie Gentry becomes "too hard". She denies VH; however, says she has auditory hallucinations and anxiety that reduce when she paces. She says she is trying to make friends in the unit but states it has been difficult.  From admission H&P: This is the second psychiatric admission in 2 weeks time, the first at our facility for this 24 year old single female patient who was released from [facility], but brought directly to [facility] by her father concerned about continued psychotic symptoms. The patient presents to [place] under petition for involuntary commitment, which describes substance abuse, bizarre behavior, nonsensical statements, and suicidal thoughts. She expressed to our assessment team she plan to "drink bleach" and reported ongoing visual hallucinations of seeing demons, and auditory hallucinations urging her to harm herself.  Principal Problem: <principal problem not specified> Diagnosis: Active Problems:   Brief psychotic disorder (HCC)   Schizophreniform disorder (HCC)  Total Time spent with patient: 15 minutes  Past Psychiatric History: See admission H&P  Past Medical History: History reviewed. No pertinent past medical history. History reviewed. No pertinent surgical history. Family History: History reviewed. No pertinent family history. Family Psychiatric  History: See admission H&P Social History:  Social History   Substance and Sexual Activity  Alcohol Use Not Currently      Social History   Substance and Sexual Activity  Drug Use Yes  . Types: Marijuana   Comment: occasionally    Social History   Socioeconomic History  . Marital status: Single    Spouse name: Not on file  . Number of children: Not on file  . Years of education: 46  . Highest education level: High school graduate  Occupational History  . Occupation: Unemployed  Tobacco Use  . Smoking status: Current Some Day Smoker    Packs/day: 1.00    Types: Cigarettes  . Smokeless tobacco: Never Used  Substance and Sexual Activity  . Alcohol use: Not Currently  . Drug use: Yes    Types: Marijuana    Comment: occasionally  . Sexual activity: Yes    Birth control/protection: None  Other Topics Concern  . Not on file  Social History Narrative   Pt is unemployed; lives in [place] with her father   Social Determinants of Health   Financial Resource Strain:   . Difficulty of Paying Living Expenses: Not on file  Food Insecurity:   . Worried About Programme researcher, broadcasting/film/video in the Last Year: Not on file  . Ran Out of Food in the Last Year: Not on file  Transportation Needs:   . Lack of Transportation (Medical): Not on file  . Lack of Transportation (Non-Medical): Not on file  Physical Activity:   . Days of Exercise per Week: Not on file  . Minutes of Exercise per Session: Not on file  Stress:   . Feeling of Stress : Not on file  Social Connections:   . Frequency of Communication with Friends and Family: Not on file  . Frequency of Social Gatherings with Friends and Family: Not  on file  . Attends Religious Services: Not on file  . Active Member of Clubs or Organizations: Not on file  . Attends Banker Meetings: Not on file  . Marital Status: Not on file   Additional Social History:    Pain Medications: See MAR Prescriptions: See MAR Over the Counter: See MAR History of alcohol / drug use?: Yes Longest period of sobriety (when/how long): UTA Negative Consequences of Use:  Work / School Name of Substance 1: THC 1 - Frequency: UTA Name of Substance 2: Amphetamines 2 - Frequency: UTA Name of Substance 3: benzos 3 - Frequency: UTA 3 - Last Use / Amount: UTA    Sleep: Fair  Appetite:  Fair  Current Medications: Current Facility-Administered Medications  Medication Dose Route Frequency Provider Last Rate Last Admin  . acetaminophen (TYLENOL) tablet 650 mg  650 mg Oral Q6H PRN Anike, Adaku C, NP   650 mg at 06/09/19 2059  . benztropine (COGENTIN) tablet 1 mg  1 mg Oral BID PRN Cobos, Rockey Situ, MD      . busPIRone (BUSPAR) tablet 10 mg  10 mg Oral BID Cobos, Rockey Situ, MD   10 mg at 06/12/19 1635  . feeding supplement (ENSURE ENLIVE) (ENSURE ENLIVE) liquid 237 mL  237 mL Oral BID BM Malvin Johns, MD   237 mL at 06/12/19 1603  . FLUoxetine (PROZAC) capsule 10 mg  10 mg Oral Daily Malvin Johns, MD   10 mg at 06/12/19 0737  . LORazepam (ATIVAN) tablet 0.5 mg  0.5 mg Oral Q6H PRN Cobos, Rockey Situ, MD   0.5 mg at 06/12/19 0837  . nicotine (NICODERM CQ - dosed in mg/24 hours) patch 21 mg  21 mg Transdermal Daily Malvin Johns, MD   21 mg at 06/12/19 0757  . OLANZapine zydis (ZYPREXA) disintegrating tablet 10 mg  10 mg Oral Q8H PRN Antonieta Pert, MD   10 mg at 06/12/19 0519  . omega-3 acid ethyl esters (LOVAZA) capsule 1 g  1 g Oral BID Malvin Johns, MD   1 g at 06/12/19 1635  . perphenazine (TRILAFON) tablet 4 mg  4 mg Oral TID Malvin Johns, MD   4 mg at 06/12/19 1635  . pneumococcal 23 valent vaccine (PNEUMOVAX-23) injection 0.5 mL  0.5 mL Intramuscular Tomorrow-1000 Malvin Johns, MD      . prenatal multivitamin tablet 1 tablet  1 tablet Oral Daily Malvin Johns, MD   1 tablet at 06/12/19 [phone]  . traZODone (DESYREL) tablet 50 mg  50 mg Oral QHS PRN Cobos, Rockey Situ, MD   50 mg at 06/12/19 2028    Lab Results:  Results for orders placed or performed during the hospital encounter of 05/31/19 (from the past 48 hour(s))  CBC with Differential/Platelet      Status: Abnormal   Collection Time: 06/12/19  6:23 AM  Result Value Ref Range   WBC 5.2 4.0 - 10.5 K/uL   RBC 4.14 3.87 - 5.11 MIL/uL   Hemoglobin 11.5 (L) 12.0 - 15.0 g/dL   HCT 52.7 78.2 - 42.3 %   MCV 87.9 80.0 - 100.0 fL   MCH 27.8 26.0 - 34.0 pg   MCHC 31.6 30.0 - 36.0 g/dL   RDW 53.6 14.4 - 31.5 %   Platelets 285 150 - 400 K/uL   nRBC 0.0 0.0 - 0.2 %   Neutrophils Relative % 60 %   Neutro Abs 3.1 1.7 - 7.7 K/uL   Lymphocytes Relative 23 %  Lymphs Abs 1.2 0.7 - 4.0 K/uL   Monocytes Relative 15 %   Monocytes Absolute 0.8 0.1 - 1.0 K/uL   Eosinophils Relative 2 %   Eosinophils Absolute 0.1 0.0 - 0.5 K/uL   Basophils Relative 0 %   Basophils Absolute 0.0 0.0 - 0.1 K/uL   Immature Granulocytes 0 %   Abs Immature Granulocytes 0.02 0.00 - 0.07 K/uL    Comment: Performed at [facility], [address] [place], [place] [address]  Comprehensive metabolic panel     Status: Abnormal   Collection Time: 06/12/19  6:23 AM  Result Value Ref Range   Sodium 138 135 - 145 mmol/L   Potassium 4.0 3.5 - 5.1 mmol/L   Chloride 106 98 - 111 mmol/L   CO2 25 22 - 32 mmol/L   Glucose, Bld 75 70 - 99 mg/dL   BUN 17 6 - 20 mg/dL   Creatinine, Ser 3.81 0.44 - 1.00 mg/dL   Calcium 9.0 8.9 - 01.7 mg/dL   Total Protein 7.2 6.5 - 8.1 g/dL   Albumin 4.0 3.5 - 5.0 g/dL   AST 73 (H) 15 - 41 U/L   ALT 150 (H) 0 - 44 U/L   Alkaline Phosphatase 57 38 - 126 U/L   Total Bilirubin 0.6 0.3 - 1.2 mg/dL   GFR calc non Af Amer >60 >60 mL/min   GFR calc Af Amer >60 >60 mL/min   Anion gap 7 5 - 15    Comment: Performed at [facility], [address] [place], [place] [address]    Blood Alcohol level:  Lab Results  Component Value Date   ETH <10 03/21/2018    Metabolic Disorder Labs: Lab Results  Component Value Date   HGBA1C 5.1 06/01/2019   MPG 100 06/01/2019   Lab Results  Component Value Date   PROLACTIN 12.7 06/01/2019   Lab Results  Component Value  Date   CHOL 178 06/01/2019   TRIG 59 06/01/2019   HDL 54 06/01/2019   CHOLHDL 3.3 06/01/2019   VLDL 12 06/01/2019   LDLCALC 112 (H) 06/01/2019    Physical Findings: AIMS: Facial and Oral Movements Muscles of Facial Expression: None, normal Lips and Perioral Area: None, normal Jaw: None, normal Tongue: None, normal,Extremity Movements Upper (arms, wrists, hands, fingers): None, normal Lower (legs, knees, ankles, toes): None, normal, Trunk Movements Neck, shoulders, hips: None, normal, Overall Severity Severity of abnormal movements (highest score from questions above): None, normal Incapacitation due to abnormal movements: None, normal Patient's awareness of abnormal movements (rate only patient's report): No Awareness, Dental Status Current problems with teeth and/or dentures?: No Does patient usually wear dentures?: No  CIWA:    COWS:     Musculoskeletal: Strength & Muscle Tone: within normal limits Gait & Station: normal Patient leans: N/A  Psychiatric Specialty Exam: Physical Exam  Nursing note and vitals reviewed. Constitutional: She is oriented to person, place, and time. She appears well-developed and well-nourished.  Cardiovascular: Normal rate.  Respiratory: Effort normal.  Neurological: She is alert and oriented to person, place, and time.    Review of Systems  Constitutional: Negative.   Respiratory: Negative for cough and shortness of breath.   Psychiatric/Behavioral: Negative for agitation, behavioral problems, dysphoric mood, hallucinations, self-injury, sleep disturbance and suicidal ideas. The patient is nervous/anxious. The patient is not hyperactive.     Blood pressure 112/79, pulse 96, temperature (!) 97.4 F (36.3 C), temperature source Oral, resp. rate 16, height 5\' 2"  (  1.575 m), weight 47.5 kg, SpO2 99 %.Body mass index is 19.15 kg/m.  General Appearance: Casual  Eye Contact:  Minimal  Speech:  Pressured  Volume:  Normal  Mood:  Anxious   Affect:  Congruent  Thought Process:  Descriptions of Associations: Tangential  Orientation:  Full (Time, Place, and Person)  Thought Content:  Tangential  Suicidal Thoughts:  No  Homicidal Thoughts:  No  Memory:  Immediate;   Fair Recent;   Fair  Judgement:  Fair  Insight:  Lacking  Psychomotor Activity:  Increased  Concentration:  Concentration: Fair and Attention Span: Poor  Recall:  [employer] of Knowledge:  Fair  Language:  Fair  Akathisia:  No  Handed:  Right  AIMS (if indicated):     Assets:  Housing Leisure Time Resilience  ADL's:  Intact  Cognition:  WNL  Sleep:  Number of Hours: 5.25     Treatment Plan Summary: Daily contact with patient to assess and evaluate symptoms and progress in treatment and Medication management   Continue inpatient hospitalization  - Normal CBC expect low Hgb (11.5) - Normal CMP expect elevated AST/ALT (73/150) - Continue but escalated Trilafon 6 mg PO TID for psychosis - Continue Cogentin 1 mg PO BID PRN for tremors - Continue Buspar 10 mg PO BID for anxiety - Continue Prozac 10 mg PO daily for depression/anxiety - Continue Ativan 0.5 mg PO Q6HR PRN for anxiety - Continue Zyprexa 10 mg PO Q8HR PRN for agitation - Continue Lovaza 1 g PO BID for neuroprotection - Continue trazodone 50 mg PO QHS PRN for insomnia - Continue therapeutic group milieu  Discharge disposition in progress, [facility] pending- However father wishes for the rehabilitation program to be processed as involuntarily, explained that it can be done on an involuntary basis- Further, the patient psychosis may prohibit her from meaningfully participating.  The father was hoping to get her at [facility], further, the patient acknowledged cravings of drugs to her father over the weekend   Fonnie Mu, Medical Student 06/13/2019, 7:45 AM

## 2019-06-13 NOTE — Progress Notes (Addendum)
   06/13/19 2237  Psych Admission Type (Psych Patients Only)  Admission Status Voluntary  Psychosocial Assessment  Patient Complaints Anxiety;Restlessness  Eye Contact Fair  Facial Expression Flat  Affect Anxious  Speech Tangential  Interaction Assertive;Attention-seeking  Motor Activity Fidgety;Pacing  Appearance/Hygiene Unremarkable  Behavior Characteristics Cooperative;Anxious  Mood Pleasant;Anxious  Aggressive Behavior  Effect No apparent injury  Thought Process  Coherency Disorganized  Content Blaming others  Delusions None reported or observed  Perception Derealization  Hallucination None reported or observed  Judgment Poor  Confusion None  Danger to Self  Current suicidal ideation? Denies  Self-Injurious Behavior No self-injurious ideation or behavior indicators observed or expressed   Danger to Others  Danger to Others None reported or observed  Pt agitated rating it 6/10; denies depression. Endorses passive HI against someone named Morrie Sheldon whom she says "doesn't take care of the children." Given Zyprexa 10 mg for anxiety. Pt believes people are trying to take advantage of her.

## 2019-06-13 NOTE — Progress Notes (Signed)
CSW left a voicemail for [facility] intake coordinator, Amy, to inquire about the status of referral and admission date.   Enid Cutter, LCSW-A Clinical Social Worker

## 2019-06-14 MED ORDER — BUSPIRONE HCL 10 MG PO TABS: 10.0000 mg | ORAL_TABLET | Freq: Two times a day (BID) | ORAL | 0 refills | Status: AC

## 2019-06-14 MED ORDER — FLUOXETINE HCL 10 MG PO CAPS: 10.0000 mg | ORAL_CAPSULE | Freq: Every day | ORAL | 0 refills | Status: AC

## 2019-06-14 MED ORDER — NICOTINE 21 MG/24HR TD PT24: 21.0000 mg | MEDICATED_PATCH | Freq: Every day | TRANSDERMAL | 0 refills | Status: AC

## 2019-06-14 MED ORDER — TRAZODONE HCL 50 MG PO TABS: 50.0000 mg | ORAL_TABLET | Freq: Every evening | ORAL | 0 refills | Status: AC | PRN

## 2019-06-14 MED ORDER — BENZTROPINE MESYLATE 1 MG PO TABS: 1.0000 mg | ORAL_TABLET | Freq: Two times a day (BID) | ORAL | 0 refills | Status: AC | PRN

## 2019-06-14 MED ORDER — OMEGA-3-ACID ETHYL ESTERS 1 G PO CAPS: 1.0000 g | ORAL_CAPSULE | Freq: Two times a day (BID) | ORAL | 0 refills | Status: AC

## 2019-06-14 MED ORDER — HYDROXYZINE HCL 50 MG PO TABS
50.0000 mg | ORAL_TABLET | Freq: Once | ORAL | Status: AC
  Administered 2019-06-14: 50 mg via ORAL
  Filled 2019-06-14 (×2): qty 1

## 2019-06-14 MED ORDER — TRAZODONE HCL 50 MG PO TABS
50.0000 mg | ORAL_TABLET | Freq: Once | ORAL | Status: AC
  Administered 2019-06-14: 50 mg via ORAL
  Filled 2019-06-14 (×2): qty 1

## 2019-06-14 MED ORDER — PRENATAL MULTIVITAMIN CH: 1.0000 | ORAL_TABLET | Freq: Every day | ORAL | 0 refills | Status: AC

## 2019-06-14 MED ORDER — PERPHENAZINE 2 MG PO TABS: 6.0000 mg | ORAL_TABLET | Freq: Three times a day (TID) | ORAL | 0 refills | Status: AC

## 2019-06-14 NOTE — Progress Notes (Signed)
Patient ID: Julie Gentry, female   DOB: 08/18/1995, 24 y.o.   MRN: [id number]   CSW contacted pt's father, [name], to inform him that the patient will be going to [facility] and [facility] will be providing transportation.  Pt's father thanked CSW and told CSW to let his daughter know that he loves her and will call/try to come see her at [facility].

## 2019-06-14 NOTE — Progress Notes (Signed)
Pt states that she is agitated because of the stress. Pt loud and cursing. Contacted provider. Given verbal order for one-time doses of Vistaril 50 mg and additional Trazodone 50 mg.
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
## 2019-06-14 NOTE — Progress Notes (Signed)
Recreation Therapy Notes  INPATIENT RECREATION TR PLAN  Patient Details Name: Lanisha Stepanian MRN: [id number] DOB: [birth year] Today's Date: 06/14/2019  Rec Therapy Plan Is patient appropriate for Therapeutic Recreation?: Yes Treatment times per week: about 3 days Estimated Length of Stay: 5-7 days TR Treatment/Interventions: Group participation (Comment)  Discharge Criteria Pt will be discharged from therapy if:: Discharged Treatment plan/goals/alternatives discussed and agreed upon by:: Patient/family  Discharge Summary Short term goals set: See pt care plan Short term goals met: Not met Progress toward goals comments: Groups attended Which groups?: Self-esteem, Coping skills, Communication, Leisure education Reason goals not met: Pt unable to focus in groups Therapeutic equipment acquired: N/A Reason patient discharged from therapy: Discharge from hospital Pt/family agrees with progress & goals achieved: Yes Date patient discharged from therapy: 06/14/19   Victorino Sparrow, LRT/CTRS  Ria Comment, Mercer Peifer A 06/14/2019, 10:31 AM
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
## 2019-06-14 NOTE — Progress Notes (Signed)
  [facility] Adult Case Management Discharge Plan :  Will you be returning to the same living situation after discharge:  No.; Patient will be going to treatment at [facility] At discharge, do you have transportation home?: Yes,  [facility] Do you have the ability to pay for your medications: No.; [facility] will provide  Release of information consent forms completed and in the chart;  Patient's signature needed at discharge.  Patient to Follow up at: Follow-up Information    [facility]. Go on 06/14/2019.   Why: You will be going to [facility] for rehab services on Tuesday, February 23rd at 10am to arrive to your intake appointment by 1pm. Please give the admission coordinator your discharge paperwork.  Contact information: [address] [place] [address] [phone]           Next level of care provider has access to [facility] Link:no  Safety Planning and Suicide Prevention discussed: Yes,  pt's father  Have you used any form of tobacco in the last 30 days? (Cigarettes, Smokeless Tobacco, Cigars, and/or Pipes): Yes  Has patient been referred to the Quitline?: Patient refused referral  Patient has been referred for addiction treatment: Yes  Delphia Grates, LCSW 06/14/2019, 9:28 AM

## 2019-06-14 NOTE — Progress Notes (Signed)
Recreation Therapy Notes  Date: 2.23.21 Time: 1000 Location: 500 Hall Dayroom  Group Topic: Communication, Team Building, Problem Solving  Goal Area(s) Addresses:  Patient will effectively work with peer towards shared goal.  Patient will identify skill used to make activity successful.  Patient will identify how skills used during activity can be used to reach post d/c goals.   Intervention: STEM Activity   Activity: In team's, using 20 small plastic cups, patients were asked to build the tallest free standing tower possible.    Education: Pharmacist, community, Building control surveyor.   Education Outcome: Acknowledges education/In group clarification offered/Needs additional education.   Clinical Observations/Feedback: Pt did not attend group session.   Caroll Rancher, LRT/CTRS    Lillia Abed, Atwell Mcdanel A 06/14/2019 11:00 AM

## 2019-06-14 NOTE — Progress Notes (Addendum)
Pt stayed in room and slept after one-time dose of Vistaril 50 mg and one-time additional dose of trazodone 50 mg. Pt may benefit from addition of Vistaril for anxiety and repeat on trazodone dosage for sleep.

## 2019-06-14 NOTE — Plan of Care (Signed)
  Problem: Education: Goal: Knowledge of [facility] Education information/materials will improve Outcome: Adequate for Discharge Goal: Emotional status will improve Outcome: Adequate for Discharge Goal: Mental status will improve Outcome: Adequate for Discharge Goal: Verbalization of understanding the information provided will improve Outcome: Adequate for Discharge   Problem: Activity: Goal: Interest or engagement in activities will improve Outcome: Adequate for Discharge Goal: Sleeping patterns will improve Outcome: Adequate for Discharge   Problem: Coping: Goal: Ability to verbalize frustrations and anger appropriately will improve Outcome: Adequate for Discharge Goal: Ability to demonstrate self-control will improve Outcome: Adequate for Discharge   Problem: Health Behavior/Discharge Planning: Goal: Identification of resources available to assist in meeting health care needs will improve Outcome: Adequate for Discharge Goal: Compliance with treatment plan for underlying cause of condition will improve Outcome: Adequate for Discharge   Problem: Physical Regulation: Goal: Ability to maintain clinical measurements within normal limits will improve Outcome: Adequate for Discharge   Problem: Safety: Goal: Periods of time without injury will increase Outcome: Adequate for Discharge   Problem: Education: Goal: Ability to make informed decisions regarding treatment will improve Outcome: Adequate for Discharge   Problem: Coping: Goal: Coping ability will improve Outcome: Adequate for Discharge   Patient verbalizes readiness for discharge. Follow up plan explained, AVS, Transition record and SRA given. Prescriptions and teaching provided. Belongings returned and signed for. Suicide safety plan completed and signed. Patient verbalizes understanding. Patient denies SI/HI and assures this Clinical research associate they will seek assistance should that change. Patient discharged to lobby  where safe transport was waiting.  Problem: Health Behavior/Discharge Planning: Goal: Identification of resources available to assist in meeting health care needs will improve Outcome: Adequate for Discharge   Problem: Medication: Goal: Compliance with prescribed medication regimen will improve Outcome: Adequate for Discharge   Problem: Self-Concept: Goal: Ability to disclose and discuss suicidal ideas will improve Outcome: Adequate for Discharge Goal: Will verbalize positive feelings about self Outcome: Adequate for Discharge

## 2019-06-14 NOTE — Plan of Care (Signed)
Pt was unable to focus during recreation therapy group sessions.    Caroll Rancher, LRT/CTRS

## 2019-09-11 ENCOUNTER — Ambulatory Visit (HOSPITAL_COMMUNITY)
Admission: RE | Admit: 2019-09-11 | Discharge: 2019-09-11 | Disposition: A | Discharge status: Home / Self Care | Payer: Medicaid Other | Attending: Psychiatry | Admitting: Psychiatry

## 2019-09-11 DIAGNOSIS — F2081 Schizophreniform disorder: Secondary | ICD-10-CM | POA: Diagnosis present

## 2019-09-11 DIAGNOSIS — F209 Schizophrenia, unspecified: Secondary | ICD-10-CM | POA: Diagnosis present

## 2019-09-11 NOTE — H&P (Signed)
Behavioral Health Medical Screening Exam  Julie Gentry is an 24 y.o. female.  Total Time spent with patient: 30 minutes  Psychiatric Specialty Exam: Physical Exam  Nursing note and vitals reviewed. Constitutional: She is oriented to person, place, and time. She appears well-developed and well-nourished.  HENT:  Head: Normocephalic.  Respiratory: Effort normal.  Musculoskeletal:        General: Normal range of motion.     Cervical back: Normal range of motion.  Neurological: She is alert and oriented to person, place, and time.  Psychiatric: Her speech is normal and behavior is normal. Judgment and thought content normal. Her mood appears anxious. Cognition and memory are normal.    Review of Systems  Psychiatric/Behavioral: The patient is nervous/anxious.   All other systems reviewed and are negative.   There were no vitals taken for this visit.There is no height or weight on file to calculate BMI.  General Appearance: Casual  Eye Contact:  Good  Speech:  Normal Rate  Volume:  Normal  Mood:  Anxious  Affect:  Blunt  Thought Process:  Coherent and Descriptions of Associations: Intact  Orientation:  Full (Time, Place, and Person)  Thought Content:  WDL and Logical  Suicidal Thoughts:  No  Homicidal Thoughts:  No  Memory:  Immediate;   Good Recent;   Good Remote;   Good  Judgement:  Fair  Insight:  Fair  Psychomotor Activity:  Normal  Concentration: Concentration: Good and Attention Span: Good  Recall:  Good  Fund of Knowledge:Good  Language: Good  Akathisia:  No  Handed:  Right  AIMS (if indicated):     Assets:  Housing Leisure Time Physical Health Resilience Social Support  Sleep:       Musculoskeletal: Strength & Muscle Tone: within normal limits Gait & Station: normal Patient leans: N/A  There were no vitals taken for this visit.  Recommendations: Follow up with outpatient resources provided by the counselor for therapy and support  groups Based on my evaluation the patient does not appear to have an emergency medical condition.  Nanine Means, NP 09/11/2019, 6:38 PM
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
## 2019-10-11 ENCOUNTER — Telehealth: Payer: Self-pay | Admitting: Pharmacy Technician

## 2019-10-11 ENCOUNTER — Encounter: Payer: Self-pay | Admitting: Family

## 2019-10-11 NOTE — Telephone Encounter (Addendum)
RCID Patient Product/process development scientist completed.    The patient is insured through [facility] and has a $3 copay.  A readiness to treat form will need to be signed by patient.  We will continue to follow to see if copay assistance is needed.  Netty Starring. Dimas Aguas CPhT Specialty Pharmacy Patient [facility] for Infectious Disease Phone: [phone] Fax:  [phone]

## 2020-03-22 ENCOUNTER — Emergency Department (HOSPITAL_COMMUNITY)
Admission: EM | Admit: 2020-03-22 | Discharge: 2020-03-22 | Disposition: A | Discharge status: Home / Self Care | Payer: Medicaid Other | Attending: Emergency Medicine | Admitting: Emergency Medicine

## 2020-03-22 ENCOUNTER — Emergency Department (HOSPITAL_COMMUNITY): Payer: Medicaid Other

## 2020-03-22 DIAGNOSIS — F1510 Other stimulant abuse, uncomplicated: Secondary | ICD-10-CM | POA: Insufficient documentation

## 2020-03-22 DIAGNOSIS — T7421XA Adult sexual abuse, confirmed, initial encounter: Secondary | ICD-10-CM | POA: Diagnosis not present

## 2020-03-22 DIAGNOSIS — R441 Visual hallucinations: Secondary | ICD-10-CM | POA: Insufficient documentation

## 2020-03-22 DIAGNOSIS — F17210 Nicotine dependence, cigarettes, uncomplicated: Secondary | ICD-10-CM | POA: Insufficient documentation

## 2020-03-22 DIAGNOSIS — F1910 Other psychoactive substance abuse, uncomplicated: Secondary | ICD-10-CM

## 2020-03-22 DIAGNOSIS — R440 Auditory hallucinations: Secondary | ICD-10-CM | POA: Diagnosis not present

## 2020-03-22 LAB — CBC WITH DIFFERENTIAL/PLATELET
Abs Immature Granulocytes: 0.02 10*3/uL (ref 0.00–0.07)
Basophils Absolute: 0 10*3/uL (ref 0.0–0.1)
Basophils Relative: 0 %
Eosinophils Absolute: 0.1 10*3/uL (ref 0.0–0.5)
Eosinophils Relative: 1 %
HCT: 34.6 % — ABNORMAL LOW (ref 36.0–46.0)
Hemoglobin: 10.9 g/dL — ABNORMAL LOW (ref 12.0–15.0)
Immature Granulocytes: 0 %
Lymphocytes Relative: 26 %
Lymphs Abs: 1.9 10*3/uL (ref 0.7–4.0)
MCH: 26.5 pg (ref 26.0–34.0)
MCHC: 31.5 g/dL (ref 30.0–36.0)
MCV: 84 fL (ref 80.0–100.0)
Monocytes Absolute: 0.7 10*3/uL (ref 0.1–1.0)
Monocytes Relative: 9 %
Neutro Abs: 4.7 10*3/uL (ref 1.7–7.7)
Neutrophils Relative %: 64 %
Platelets: 372 10*3/uL (ref 150–400)
RBC: 4.12 MIL/uL (ref 3.87–5.11)
RDW: 13.1 % (ref 11.5–15.5)
WBC: 7.3 10*3/uL (ref 4.0–10.5)
nRBC: 0 % (ref 0.0–0.2)

## 2020-03-22 LAB — COMPREHENSIVE METABOLIC PANEL
ALT: 13 U/L (ref 0–44)
AST: 17 U/L (ref 15–41)
Albumin: 4.2 g/dL (ref 3.5–5.0)
Alkaline Phosphatase: 60 U/L (ref 38–126)
Anion gap: 12 (ref 5–15)
BUN: 8 mg/dL (ref 6–20)
CO2: 20 mmol/L — ABNORMAL LOW (ref 22–32)
Calcium: 9.1 mg/dL (ref 8.9–10.3)
Chloride: 108 mmol/L (ref 98–111)
Creatinine, Ser: 0.57 mg/dL (ref 0.44–1.00)
GFR, Estimated: >60 mL/min (ref >60)
Glucose, Bld: 97 mg/dL (ref 70–99)
Potassium: 3.4 mmol/L — ABNORMAL LOW (ref 3.5–5.1)
Sodium: 140 mmol/L (ref 135–145)
Total Bilirubin: 0.6 mg/dL (ref 0.3–1.2)
Total Protein: 7.4 g/dL (ref 6.5–8.1)

## 2020-03-22 LAB — I-STAT BETA HCG BLOOD, ED (MC, WL, AP ONLY): I-stat hCG, quantitative: <5 m[IU]/mL (ref <5)

## 2020-03-22 LAB — SALICYLATE LEVEL: Salicylate Lvl: <7 mg/dL — ABNORMAL LOW (ref 7.0–30.0)

## 2020-03-22 LAB — ETHANOL: Alcohol, Ethyl (B): <10 mg/dL (ref <10)

## 2020-03-22 LAB — ACETAMINOPHEN LEVEL: Acetaminophen (Tylenol), Serum: <10 ug/mL — ABNORMAL LOW (ref 10–30)

## 2020-03-22 MED ORDER — LACTATED RINGERS IV BOLUS
1000.0000 mL | Freq: Once | INTRAVENOUS | Status: AC
  Administered 2020-03-22: 1000 mL via INTRAVENOUS
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
## 2020-03-22 NOTE — ED Notes (Signed)
Dad Vincenza Hews) wants an update [phone]
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
## 2020-03-22 NOTE — ED Notes (Signed)
Pt pulled IV out, st's she is going to leave.  St's she is going to stay with her dad

## 2020-03-22 NOTE — ED Notes (Signed)
Pt's father at bedside
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
## 2020-03-23 NOTE — SANE Note (Signed)
At approximately 11:30pm the SANE/FNE Teacher, music) consult has been completed. The primary RN, K. Fields, and provider have been notified. Please contact the SANE/FNE nurse on call (listed in Amion) with any further concerns.
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
## 2022-04-27 ENCOUNTER — Emergency Department (HOSPITAL_COMMUNITY): Payer: Medicaid Other

## 2022-04-27 ENCOUNTER — Emergency Department (HOSPITAL_COMMUNITY)
Admission: EM | Admit: 2022-04-27 | Discharge: 2022-04-29 | Disposition: A | Discharge status: Other Acute Inpatient Hospital | Payer: Medicaid Other | Attending: Emergency Medicine | Admitting: Emergency Medicine

## 2022-04-27 DIAGNOSIS — F17210 Nicotine dependence, cigarettes, uncomplicated: Secondary | ICD-10-CM | POA: Diagnosis not present

## 2022-04-27 DIAGNOSIS — Z20822 Contact with and (suspected) exposure to covid-19: Secondary | ICD-10-CM | POA: Diagnosis not present

## 2022-04-27 DIAGNOSIS — F23 Brief psychotic disorder: Secondary | ICD-10-CM | POA: Diagnosis not present

## 2022-04-27 DIAGNOSIS — T520X4A Toxic effect of petroleum products, undetermined, initial encounter: Secondary | ICD-10-CM

## 2022-04-27 DIAGNOSIS — T520X2A Toxic effect of petroleum products, intentional self-harm, initial encounter: Secondary | ICD-10-CM | POA: Diagnosis not present

## 2022-04-27 DIAGNOSIS — T1491XA Suicide attempt, initial encounter: Secondary | ICD-10-CM

## 2022-04-27 LAB — CBC WITH DIFFERENTIAL/PLATELET
Abs Immature Granulocytes: 0.01 10*3/uL (ref 0.00–0.07)
Basophils Absolute: 0 10*3/uL (ref 0.0–0.1)
Basophils Relative: 0 %
Eosinophils Absolute: 0 10*3/uL (ref 0.0–0.5)
Eosinophils Relative: 0 %
HCT: 36 % (ref 36.0–46.0)
Hemoglobin: 11.4 g/dL — ABNORMAL LOW (ref 12.0–15.0)
Immature Granulocytes: 0 %
Lymphocytes Relative: 10 %
Lymphs Abs: 0.7 10*3/uL (ref 0.7–4.0)
MCH: 27.2 pg (ref 26.0–34.0)
MCHC: 31.7 g/dL (ref 30.0–36.0)
MCV: 85.9 fL (ref 80.0–100.0)
Monocytes Absolute: 0.8 10*3/uL (ref 0.1–1.0)
Monocytes Relative: 12 %
Neutro Abs: 4.9 10*3/uL (ref 1.7–7.7)
Neutrophils Relative %: 78 %
Platelets: 432 10*3/uL — ABNORMAL HIGH (ref 150–400)
RBC: 4.19 MIL/uL (ref 3.87–5.11)
RDW: 13.8 % (ref 11.5–15.5)
WBC: 6.4 10*3/uL (ref 4.0–10.5)
nRBC: 0 % (ref 0.0–0.2)

## 2022-04-27 LAB — ETHANOL: Alcohol, Ethyl (B): <10 mg/dL (ref <10)

## 2022-04-27 LAB — I-STAT VENOUS BLOOD GAS, ED
Acid-Base Excess: 2 mmol/L (ref 0.0–2.0)
Bicarbonate: 26.1 mmol/L (ref 20.0–28.0)
Calcium, Ion: 1.14 mmol/L — ABNORMAL LOW (ref 1.15–1.40)
HCT: 34 % — ABNORMAL LOW (ref 36.0–46.0)
Hemoglobin: 11.6 g/dL — ABNORMAL LOW (ref 12.0–15.0)
O2 Saturation: 96 %
Potassium: 4 mmol/L (ref 3.5–5.1)
Sodium: 139 mmol/L (ref 135–145)
TCO2: 27 mmol/L (ref 22–32)
pCO2, Ven: 37.2 mmHg — ABNORMAL LOW (ref 44–60)
pH, Ven: 7.454 — ABNORMAL HIGH (ref 7.25–7.43)
pO2, Ven: 76 mmHg — ABNORMAL HIGH (ref 32–45)

## 2022-04-27 LAB — CBG MONITORING, ED: Glucose-Capillary: 88 mg/dL (ref 70–99)

## 2022-04-27 LAB — I-STAT CHEM 8, ED
BUN: 12 mg/dL (ref 6–20)
Calcium, Ion: 1.14 mmol/L — ABNORMAL LOW (ref 1.15–1.40)
Chloride: 104 mmol/L (ref 98–111)
Creatinine, Ser: 0.6 mg/dL (ref 0.44–1.00)
Glucose, Bld: 91 mg/dL (ref 70–99)
HCT: 35 % — ABNORMAL LOW (ref 36.0–46.0)
Hemoglobin: 11.9 g/dL — ABNORMAL LOW (ref 12.0–15.0)
Potassium: 4.1 mmol/L (ref 3.5–5.1)
Sodium: 140 mmol/L (ref 135–145)
TCO2: 26 mmol/L (ref 22–32)

## 2022-04-27 LAB — I-STAT BETA HCG BLOOD, ED (MC, WL, AP ONLY): I-stat hCG, quantitative: <5 m[IU]/mL (ref <5)

## 2022-04-27 LAB — COMPREHENSIVE METABOLIC PANEL
ALT: 18 U/L (ref 0–44)
AST: 18 U/L (ref 15–41)
Albumin: 4 g/dL (ref 3.5–5.0)
Alkaline Phosphatase: 80 U/L (ref 38–126)
Anion gap: 12 (ref 5–15)
BUN: 10 mg/dL (ref 6–20)
CO2: 24 mmol/L (ref 22–32)
Calcium: 9.2 mg/dL (ref 8.9–10.3)
Chloride: 102 mmol/L (ref 98–111)
Creatinine, Ser: 0.74 mg/dL (ref 0.44–1.00)
GFR, Estimated: >60 mL/min (ref >60)
Glucose, Bld: 93 mg/dL (ref 70–99)
Potassium: 4 mmol/L (ref 3.5–5.1)
Sodium: 138 mmol/L (ref 135–145)
Total Bilirubin: 0.7 mg/dL (ref 0.3–1.2)
Total Protein: 7.6 g/dL (ref 6.5–8.1)

## 2022-04-27 LAB — ACETAMINOPHEN LEVEL
Acetaminophen (Tylenol), Serum: <10 ug/mL — ABNORMAL LOW (ref 10–30)
Acetaminophen (Tylenol), Serum: <10 ug/mL — ABNORMAL LOW (ref 10–30)

## 2022-04-27 LAB — URINALYSIS, ROUTINE W REFLEX MICROSCOPIC
Bilirubin Urine: NEGATIVE
Glucose, UA: NEGATIVE mg/dL
Hgb urine dipstick: NEGATIVE
Ketones, ur: NEGATIVE mg/dL
Leukocytes,Ua: NEGATIVE
Nitrite: NEGATIVE
Protein, ur: NEGATIVE mg/dL
Specific Gravity, Urine: 1.006 (ref 1.005–1.030)
pH: 8 (ref 5.0–8.0)

## 2022-04-27 LAB — SALICYLATE LEVEL: Salicylate Lvl: <7 mg/dL — ABNORMAL LOW (ref 7.0–30.0)

## 2022-04-27 LAB — RAPID URINE DRUG SCREEN, HOSP PERFORMED
Amphetamines: NOT DETECTED
Barbiturates: NOT DETECTED
Benzodiazepines: NOT DETECTED
Cocaine: NOT DETECTED
Opiates: NOT DETECTED
Tetrahydrocannabinol: POSITIVE — AB

## 2022-04-27 MED ORDER — TRAZODONE HCL 50 MG PO TABS
50.0000 mg | ORAL_TABLET | Freq: Every day | ORAL | Status: DC
  Administered 2022-04-27 – 2022-04-28 (×2): 50 mg via ORAL
  Filled 2022-04-27 (×2): qty 1

## 2022-04-27 MED ORDER — BUPROPION HCL ER (XL) 150 MG PO TB24
150.0000 mg | ORAL_TABLET | Freq: Every day | ORAL | Status: DC
  Administered 2022-04-27 – 2022-04-29 (×3): 150 mg via ORAL
  Filled 2022-04-27 (×5): qty 1

## 2022-04-27 MED ORDER — FUROSEMIDE 20 MG PO TABS
20.0000 mg | ORAL_TABLET | Freq: Every day | ORAL | Status: DC
  Administered 2022-04-27 – 2022-04-28 (×2): 20 mg via ORAL
  Filled 2022-04-27 (×2): qty 1
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
## 2022-04-27 NOTE — ED Notes (Signed)
Sharion Balloon, RN from poison control called at 17:13 to check on patient
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
## 2022-04-27 NOTE — ED Provider Notes (Signed)
[facility] EMERGENCY DEPARTMENT Provider Note   CSN: [id number] Arrival date & time: 04/27/22  1038     History  No chief complaint on file.   Julie Gentry is a 27 y.o. female with history of schizophreniform disorder and polysubstance abuse who presents the emergency department after intentional ingestion of gasoline.  Her father Audelia Acton, [phone]) reports that the patient called him at about 7 AM this morning complaining that "her head is cracking and stomach is exploding".  He also reports that she told him she drank gasoline.  She was nonspecific as to when exactly she did this, but he assumed with the symptoms that she was having that it was this morning.  He initially took her to an outside hospital but it was taking too long, so we took her to an urgent care.  They were concerned and called 911 to have her transported to the ER. Father reports patient has a history of heroine use and some mental health issues, no other medical problems.   Patient herself is complaining of abdominal pain and headache.  She describes her pain as "stomach exploding".  States that "her heart blew up" before, but cannot give me a clear timeline.  She cannot specify her symptoms any further.  She also complaining of pain in her head, that she thinks may have been from a fall.  She also cannot clearly tell me when she fell or how often she falls.  She reports drinking out of a water bottle that she does not know what was in it, but thinks that this was a year ago.  She is also telling me that she drink Clorox product out a year ago trying to harm herself.  She is denying suicidal ideation, homicidal ideation, visual or auditory hallucinations at this time.  Patient overall is a difficult historian.  Level 5 caveat due to psychiatric condition.   HPI     Home Medications Prior to Admission medications   Medication Sig Start Date End Date Taking? Authorizing Provider   buPROPion (WELLBUTRIN XL) 150 MG 24 hr tablet Take 150 mg by mouth daily. 03/31/22  Yes [provider]  furosemide (LASIX) 20 MG tablet Take 20 mg by mouth daily. 04/16/22  Yes [provider]  traZODone (DESYREL) 50 MG tablet Take 1 tablet (50 mg total) by mouth at bedtime as needed for sleep. Patient taking differently: Take 50 mg by mouth at bedtime. 06/14/19  Yes Connye Burkitt, NP  benztropine (COGENTIN) 1 MG tablet Take 1 tablet (1 mg total) by mouth 2 (two) times daily as needed for tremors. Patient not taking: Reported on 04/27/2022 06/14/19   Connye Burkitt, NP  FLUoxetine (PROZAC) 10 MG capsule Take 1 capsule (10 mg total) by mouth daily. Patient not taking: Reported on 04/27/2022 06/15/19   Connye Burkitt, NP  nicotine (NICODERM CQ - DOSED IN MG/24 HOURS) 21 mg/24hr patch Place 1 patch (21 mg total) onto the skin daily. Patient not taking: Reported on 04/27/2022 06/15/19   Connye Burkitt, NP  omega-3 acid ethyl esters (LOVAZA) 1 g capsule Take 1 capsule (1 g total) by mouth 2 (two) times daily. Patient not taking: Reported on 04/27/2022 06/14/19   Connye Burkitt, NP  perphenazine (TRILAFON) 2 MG tablet Take 3 tablets (6 mg total) by mouth 3 (three) times daily. Patient not taking: Reported on 04/27/2022 06/14/19   Connye Burkitt, NP  Prenatal Vit-Fe Fumarate-FA (PRENATAL MULTIVITAMIN) TABS tablet Take  1 tablet by mouth daily. Patient not taking: Reported on 04/27/2022 06/15/19   Aldean Baker, NP      Allergies    Patient has no known allergies.    Review of Systems   Review of Systems  Unable to perform ROS: Psychiatric disorder  Gastrointestinal:  Positive for abdominal pain.  Neurological:  Positive for headaches.  Psychiatric/Behavioral:  Positive for self-injury.     Physical Exam Updated Vital Signs BP 109/76   Pulse 99   Temp 98 F (36.7 C) (Oral)   Resp 16   SpO2 99%  Physical Exam Vitals and nursing note reviewed.  Constitutional:      Appearance: Normal  appearance.  HENT:     Head: Normocephalic and atraumatic.  Eyes:     Conjunctiva/sclera: Conjunctivae normal.  Cardiovascular:     Rate and Rhythm: Normal rate and regular rhythm.  Pulmonary:     Effort: Pulmonary effort is normal. No respiratory distress.     Breath sounds: Normal breath sounds.  Abdominal:     General: There is no distension.     Palpations: Abdomen is soft.     Tenderness: There is no abdominal tenderness.  Skin:    General: Skin is warm and dry.     Comments: Bilateral hands are diffusely red, warm to the touch  Neurological:     General: No focal deficit present.     Mental Status: She is alert.  Psychiatric:        Attention and Perception: She is inattentive. She does not perceive auditory or visual hallucinations.        Mood and Affect: Affect is blunt.        Speech: Speech is delayed.        Behavior: Behavior is withdrawn.        Thought Content: Thought content does not include homicidal or suicidal ideation.    ED Results / Procedures / Treatments   Labs (all labs ordered are listed, but only abnormal results are displayed) Labs Reviewed  RAPID URINE DRUG SCREEN, HOSP PERFORMED - Abnormal; Notable for the following components:      Result Value   Tetrahydrocannabinol POSITIVE (*)    All other components within normal limits  CBC WITH DIFFERENTIAL/PLATELET - Abnormal; Notable for the following components:   Hemoglobin 11.4 (*)    Platelets 432 (*)    All other components within normal limits  SALICYLATE LEVEL - Abnormal; Notable for the following components:   Salicylate Lvl <7.0 (*)    All other components within normal limits  ACETAMINOPHEN LEVEL - Abnormal; Notable for the following components:   Acetaminophen (Tylenol), Serum <10 (*)    All other components within normal limits  URINALYSIS, ROUTINE W REFLEX MICROSCOPIC - Abnormal; Notable for the following components:   Color, Urine STRAW (*)    All other components within normal  limits  I-STAT CHEM 8, ED - Abnormal; Notable for the following components:   Calcium, Ion 1.14 (*)    Hemoglobin 11.9 (*)    HCT 35.0 (*)    All other components within normal limits  I-STAT VENOUS BLOOD GAS, ED - Abnormal; Notable for the following components:   pH, Ven 7.454 (*)    pCO2, Ven 37.2 (*)    pO2, Ven 76 (*)    Calcium, Ion 1.14 (*)    HCT 34.0 (*)    Hemoglobin 11.6 (*)    All other components within normal limits  COMPREHENSIVE METABOLIC PANEL  ETHANOL  I-STAT BETA HCG BLOOD, ED ([facility], [facility], [facility] ONLY)  CBG MONITORING, ED    EKG EKG Interpretation  Date/Time:  Sunday April 27 2022 10:58:47 EST Ventricular Rate:  94 PR Interval:  132 QRS Duration: 82 QT Interval:  360 QTC Calculation: 450 R Axis:   76 Text Interpretation: Normal sinus rhythm Normal ECG When compared with ECG of 22-Mar-2020 18:14, PREVIOUS ECG IS PRESENT Confirmed by Kristine Royal [phone]) on 04/27/2022 1:29:04 PM  Radiology DG Abd Portable 1 View  Result Date: 04/27/2022 CLINICAL DATA:  Toxic ingestion, swallowed gasoline EXAM: PORTABLE ABDOMEN - 1 VIEW; PORTABLE CHEST - 1 VIEW COMPARISON:  None Available. FINDINGS: No acute abnormality of the lungs. Heart size is normal. The bowel gas pattern is normal. No radio-opaque calculi or other significant radiographic abnormality are seen. IMPRESSION: 1.  No acute abnormality of the lungs. 2.  Nonobstructive pattern of bowel gas. Electronically Signed   By: Jearld Lesch M.D.   On: 04/27/2022 12:39   DG Chest Portable 1 View  Result Date: 04/27/2022 CLINICAL DATA:  Toxic ingestion, swallowed gasoline EXAM: PORTABLE ABDOMEN - 1 VIEW; PORTABLE CHEST - 1 VIEW COMPARISON:  None Available. FINDINGS: No acute abnormality of the lungs. Heart size is normal. The bowel gas pattern is normal. No radio-opaque calculi or other significant radiographic abnormality are seen. IMPRESSION: 1.  No acute abnormality of the lungs. 2.  Nonobstructive pattern of bowel gas.  Electronically Signed   By: Jearld Lesch M.D.   On: 04/27/2022 12:39    Procedures Procedures    Medications Ordered in ED Medications - No data to display  ED Course/ Medical Decision Making/ A&P                           Medical Decision Making  This patient is a 27 y.o. female  who presents to the ED for concern of intentional ingestion of gasoline.   Past Medical History / Co-morbidities: schizophreniform disorder and polysubstance abuse  Additional history: Chart reviewed. Pertinent results include: Per father, patient reported drinking gasoline in an attempt to harm herself. Called him at 7 am this morning to complain of this as well as abdominal pain and headache.   Physical Exam: Physical exam performed. The pertinent findings include: Patient with normal vital signs, no acute distress.  She seems inattentive on exam.  Overall blunted affect.  Speech is delayed.  While she is cooperative, she seems withdrawn.  At this time she is denying any homicidal or suicidal ideation.  Abdomen soft, nontender.  Lung sounds clear, with no difficulty breathing.  Lab Tests/Imaging studies: I personally interpreted labs/imaging and the pertinent results include: CBC and CMP unremarkable.  Negative acetaminophen and salicylate levels.  Venous blood gas pH 7.454, CO2 37.2.  Negative pregnancy.  Negative ethanol.  Urinalysis negative for hematuria or infection.  UDS positive for THC.  X-ray and abdominal x-ray with no acute findings. I agree with the radiologist interpretation.  As patient has no respiratory symptoms and we are now 8 hours postingestion, will defer repeat chest x-ray.  Cardiac monitoring: EKG obtained and interpreted by my attending physician which shows: Normal sinus rhythm  Consultations obtained: I consulted with poison control about patient's probable gasoline ingestion with my physical exam and laboratory evaluation.  Greatest concern with hydrocarbon ingestion is  pulmonary injury and pneumonitis.  Recommendation was to give antiemetics and IV fluids as needed and provide supportive care.  Recommended acetaminophen  level 4 hours postingestion.  If patient has any respiratory symptoms, recommended repeat chest x-ray 6 hours postingestion.  However patient's not having any respiratory symptoms, she can be medically cleared depending on provider discretion.   Disposition: After consideration of the diagnostic results and the patients response to treatment, I feel that patient is medically cleared from an ingestion standpoint at this time, she meets criteria for involuntary commitment.  I do not believe she has being forthcoming about the incident that prompted her visit to the emergency department today.  Patient seems withdrawn and cannot provide clear details about whether or not this was a suicide attempt, but it was reported to another provider at some time that the ingestion was intentional.  IVC papers taken out and case discussed with attending physician Dr. Rodena Medin.  TTS consulted and appreciate their recommendations. Patient medically cleared at this time.   Final Clinical Impression(s) / ED Diagnoses Final diagnoses:  Gasoline poisoning, undetermined intent, initial encounter  Suicide attempt [facility])    Rx / DC Orders ED Discharge Orders     None      Portions of this report may have been transcribed using voice recognition software. Every effort was made to ensure accuracy; however, inadvertent computerized transcription errors may be present.    Jeanella Flattery 04/27/22 1352    Wynetta Fines, MD 04/27/22 1620

## 2022-04-27 NOTE — ED Notes (Signed)
Pt resting on bed, awake breathing normal, unlabored respirations

## 2022-04-27 NOTE — ED Notes (Signed)
Personal belongings are in [place]

## 2022-04-27 NOTE — ED Notes (Signed)
IVC paperwork complete, original copy in red folder. 3 copies on purple clipboard in blue zone H20, 1 copy in medical records

## 2022-04-27 NOTE — ED Notes (Signed)
Pt unwilling to answer most questions. Will not answer SI/HI.

## 2022-04-28 ENCOUNTER — Other Ambulatory Visit: Payer: Self-pay

## 2022-04-28 ENCOUNTER — Encounter (HOSPITAL_COMMUNITY): Payer: Self-pay

## 2022-04-28 LAB — RESP PANEL BY RT-PCR (RSV, FLU A&B, COVID)  RVPGX2
Influenza A by PCR: NEGATIVE
Influenza B by PCR: NEGATIVE
Resp Syncytial Virus by PCR: NEGATIVE
SARS Coronavirus 2 by RT PCR: NEGATIVE

## 2022-04-28 NOTE — Progress Notes (Signed)
Pt was accepted to [facility] 04/29/2022, pending negative covid faxed to [phone]  Pt meets inpatient criteria per Ricky Ala, NP  Attending Physician will be Flossie Buffy, MD  Report can be called to: [phone] (this is a pager, please leave call-back number when giving report)  Pt can arrive after 8 AM  Care Team Notified: Ricky Ala, NP and Eulogio Ditch, RN  Denna Haggard, LCSWA  04/28/2022 9:54 AM

## 2022-04-28 NOTE — ED Notes (Signed)
Father Audelia Acton at bedside visiting with patient.

## 2022-04-28 NOTE — Consult Note (Signed)
Julie Gentry 27 year old female under involuntary commitment due to suicidal attempt.  She was seen and evaluated face-to-face by this provider.  Patient appears to be thought blocking and is very minimal with her responses.  She reports she was inhaling gasoline and is denying that she is suicidal or homicidal today.  Patient to continue with Wellbutrin 150 mg and trazodone 50 mg p.o. nightly for mood stabilization.  patient was accepted to [facility] and can be transported on 04/29/2022.  Case staffed with attending psychiatrist MD Dwyane Dee in treatment team will continue work inpatient recommendation.  Support, encouragement and reassurance was provided.

## 2022-04-28 NOTE — ED Notes (Signed)
Patient refused breakfast 

## 2022-04-28 NOTE — ED Notes (Signed)
Pt was accepted to [facility] 04/29/2022, pending negative covid faxed to [phone]  Pt meets inpatient criteria per Ricky Ala, NP  Attending Physician will be Flossie Buffy, MD  Report can be called to: [phone] (this is a pager, please leave call-back number when giving report)  Pt can arrive after 8 AM  Care Team Notified: Ricky Ala, NP and Eulogio Ditch, RN

## 2022-04-28 NOTE — ED Notes (Signed)
Pt attempted to leave blue pod area but stopped by other nurse. Security present to to talk to pt. Pt willingfully returned to bed and is sitting upright in bed at this time

## 2022-04-28 NOTE — Progress Notes (Signed)
LCSW Progress Note  [id number]   [facility]  04/28/2022  9:24 AM  Description:   Inpatient Psychiatric Referral  Patient was recommended inpatient per Vesta Mixer, NP. There are no available beds at [facility]. Patient was referred to the following facilities:   Destination  Service Provider Address Phone Fax  [facility]  [address], [place] [address] [phone]  [facility]  [place], [place] [address] [phone]  [facility]  [place], [place] [address] [place]  [facility]  [address] [place] [address] [phone] [place]  [place], [place] [address] [phone]  [facility]  [place] [place] [address] [place]  [facility]  [address] [place] [address] [phone]  [facility]  [address], [place] [address] [phone]  [facility]  [address] [place] [address] [phone] [place]  [address], [place] [address] [phone] [facility]  [address] [place] [address] [phone]  [facility]  [address], [place] [address] [phone] [facility]  [address] [place] [address] [phone] [facility]  [address], [place] [address] [place]  [facility]  [address] [place], [place] [address] [place]  [facility]  [address] [place] [address]  [phone] [place]  [address] [place] [address] [place]  [address] [place] [address] [phone] [place] [address] [phone]  [facility]  [place] [address] [phone] [place] [place] [address] [place]  [facility]  [address], [place]  [address] [phone]      Situation ongoing, CSW to continue following and update chart as more information becomes available.      Denna Haggard, [place]  04/28/2022 9:24 AM

## 2022-04-28 NOTE — ED Provider Notes (Signed)
Emergency Medicine Observation Re-evaluation Note  Julie Gentry is a 27 y.o. female, seen on rounds today.  Pt initially presented to the ED for complaints of Ingestion Currently, the patient is sitting on her bed resting comfortably.  Physical Exam  BP 106/75 (BP Location: Right Arm)   Pulse 85   Temp 98 F (36.7 C) (Oral)   Resp 16   Ht 5\' 2"  (1.575 m)   Wt 47.2 kg   SpO2 98%   BMI 19.02 kg/m  Physical Exam General: No acute distress or agitation visible Cardiac: No murmur on my exam Lungs: Clear bilaterally Psych: No acute agitation  ED Course / MDM  EKG:EKG Interpretation  Date/Time:  Sunday April 27 2022 10:58:47 EST Ventricular Rate:  94 PR Interval:  132 QRS Duration: 82 QT Interval:  360 QTC Calculation: 450 R Axis:   76 Text Interpretation: Normal sinus rhythm Normal ECG When compared with ECG of 22-Mar-2020 18:14, PREVIOUS ECG IS PRESENT Confirmed by Dene Gentry [phone]) on 04/27/2022 1:29:04 PM  I have reviewed the labs performed to date as well as medications administered while in observation.  Recent changes in the last 24 hours include none reported by nursing.  Plan  Current plan is for inpatient psychiatric placement.    Guinevere Stephenson, Gwenyth Allegra, MD 04/28/22 [phone]

## 2022-04-29 NOTE — ED Notes (Signed)
Patient belongings give to [facility] for transport with patient.

## 2022-04-29 NOTE — ED Provider Notes (Signed)
Emergency Medicine Observation Re-evaluation Note  Raneem Mendolia is a 27 y.o. female, seen on rounds today.  Pt initially presented to the ED for complaints of Ingestion Currently, the patient is sleeping.  Physical Exam  BP 109/74 (BP Location: Right Arm)   Pulse 69   Temp 97.6 F (36.4 C) (Oral)   Resp 18   Ht 5\' 2"  (1.575 m)   Wt 47.2 kg   SpO2 100%   BMI 19.02 kg/m  Physical Exam General: Sleeping Cardiac: Extremities perfused Lungs: Breathing is unlabored Psych: Deferred  ED Course / MDM  EKG:EKG Interpretation  Date/Time:  Sunday April 27 2022 10:58:47 EST Ventricular Rate:  94 PR Interval:  132 QRS Duration: 82 QT Interval:  360 QTC Calculation: 450 R Axis:   76 Text Interpretation: Normal sinus rhythm Normal ECG When compared with ECG of 22-Mar-2020 18:14, PREVIOUS ECG IS PRESENT Confirmed by Dene Gentry [phone]) on 04/27/2022 1:29:04 PM  I have reviewed the labs performed to date as well as medications administered while in observation.  Recent changes in the last 24 hours include accepted to [facility] with plan for transfer today.  Plan  Current plan is for transfer to [facility] today.    Godfrey Pick, MD 04/29/22 [phone]

## 2022-04-29 NOTE — ED Notes (Signed)
Attempted to call report to [facility] left message on pager per instructions

## 2022-06-09 IMAGING — CT CT HEAD W/O CM
4 series · 17 of 47 positions shown, 19 images · non-contrast
Comparison: 05/15/2019

CLINICAL DATA: HEAD TRAUMA, MILD.  SEIZURE.  HALLUCINATIONS.

EXAM:
CT HEAD WITHOUT CONTRAST
TECHNIQUE: Contiguous axial images were obtained from the base of the skull
through the vertex without intravenous contrast.

[Series 3: head wo · axial · 0.45mm/px · z∈[-176,-56]mm · 7 of 33 slices shown, 9 images]
[im 5/33  brain]
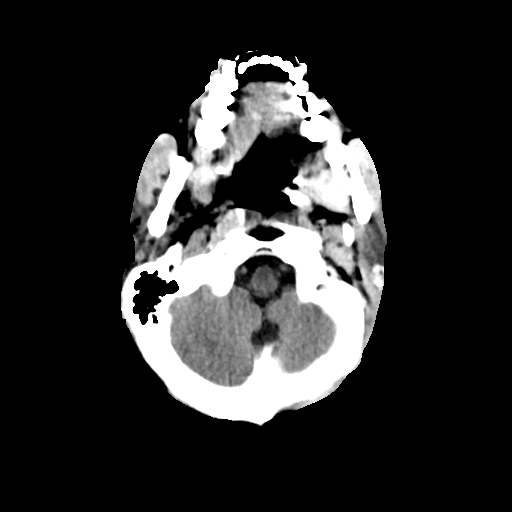
[im 5/33  bone]
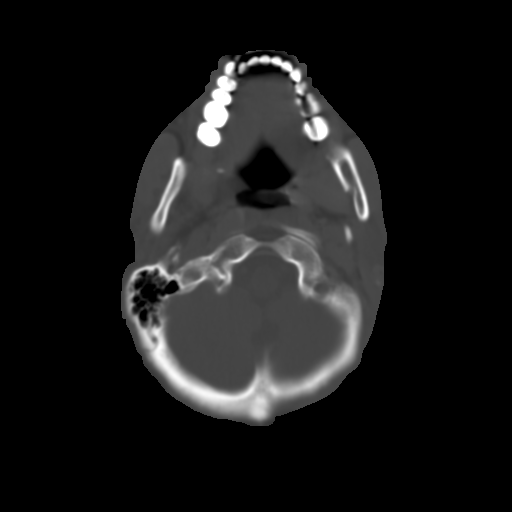
[im 9/33  brain]
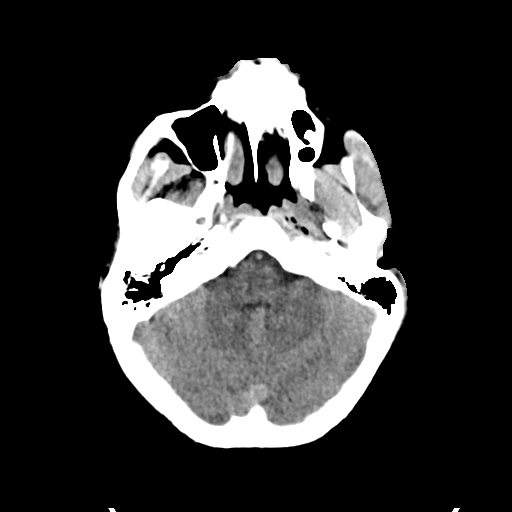
[im 13/33  brain]
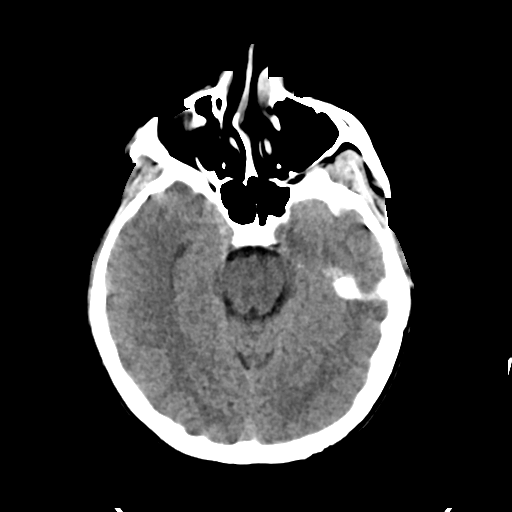
[im 17/33  brain]
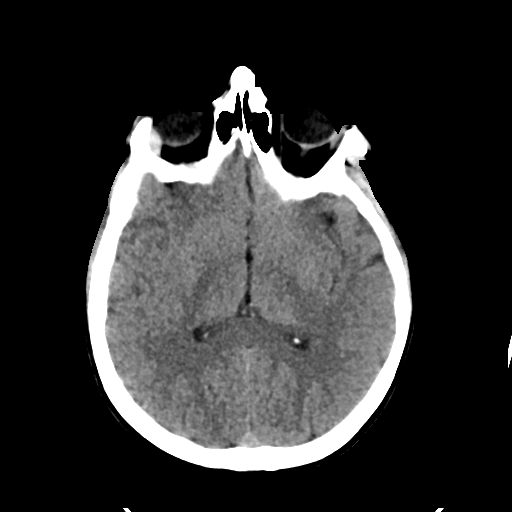
[im 21/33  brain]
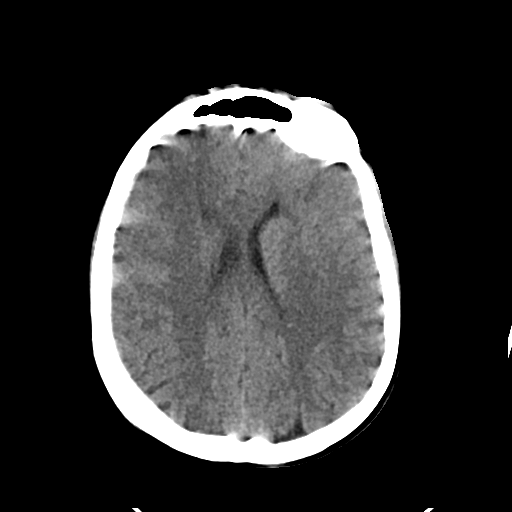
[im 21/33  bone]
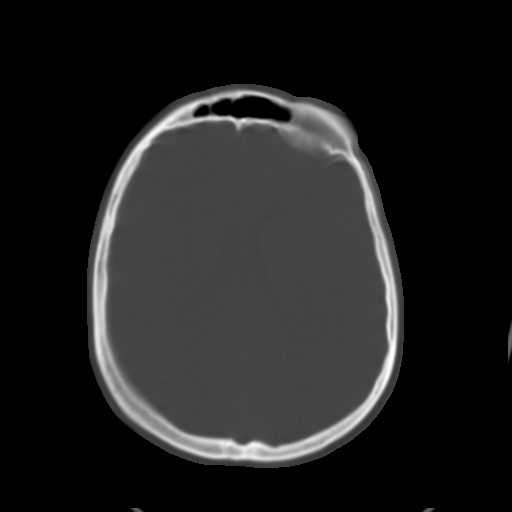
[im 25/33  brain]
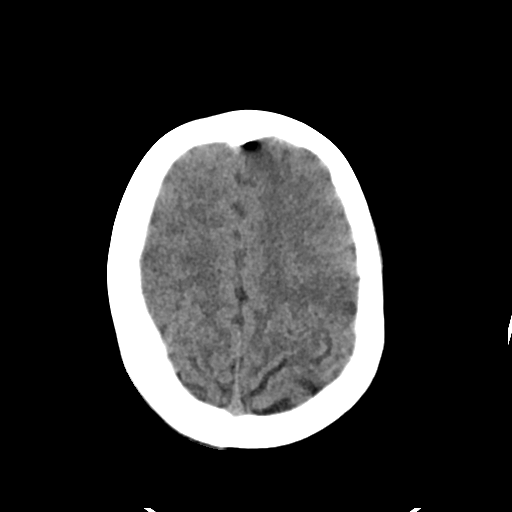
[im 29/33  brain]
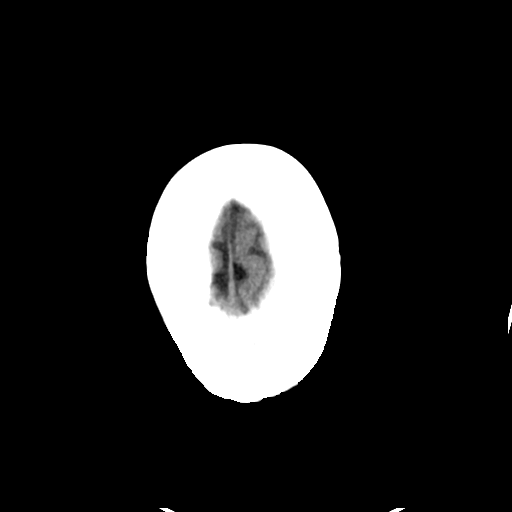

[Series 4: head bone · axial · 0.45mm/px · z∈[-180,-124]mm · 4 of 82 slices shown]
[im 9/82  bone]
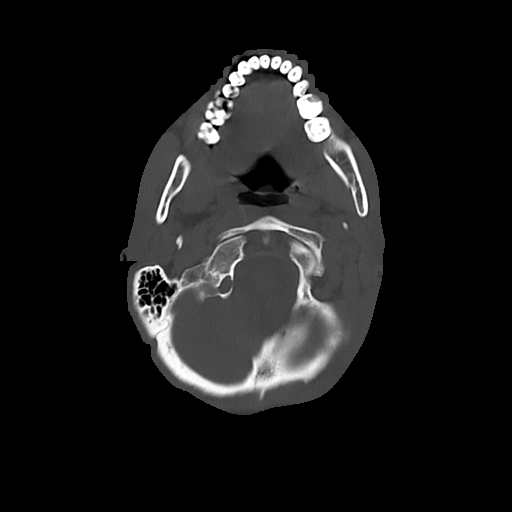
[im 17/82  bone]
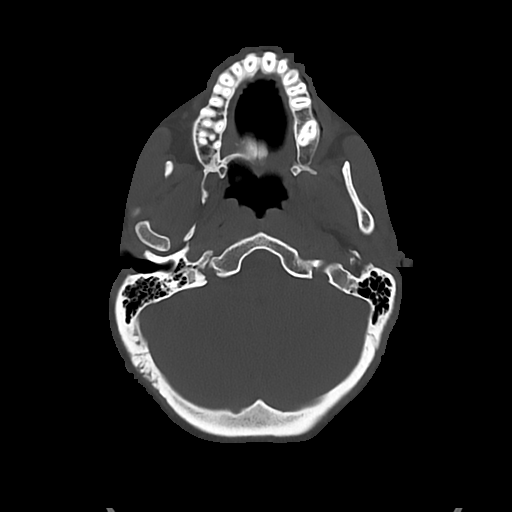
[im 25/82  bone]
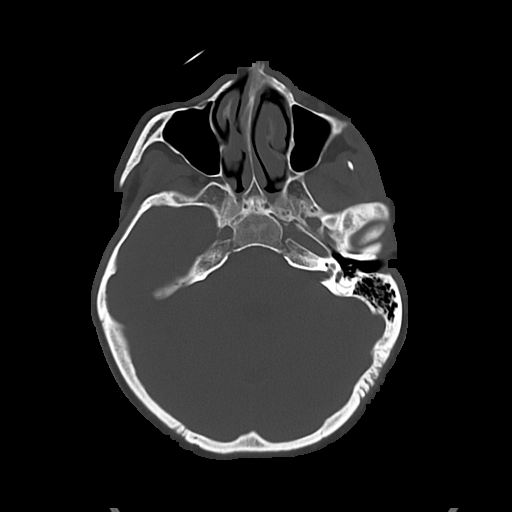
[im 37/82  bone]
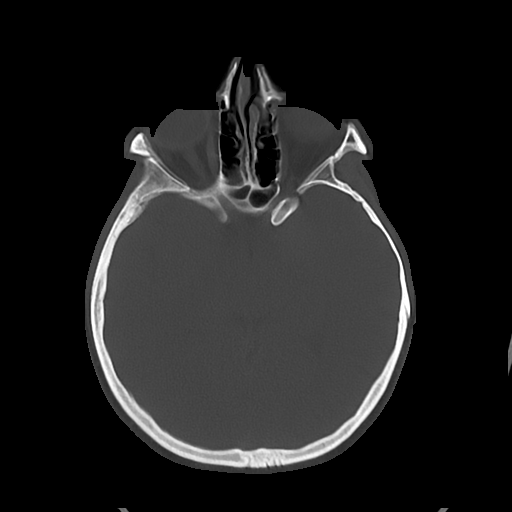

[Series 5: cor soft · coronal · 0.34mm/px · 3 of 67 slices shown]
[im 23/67  brain]
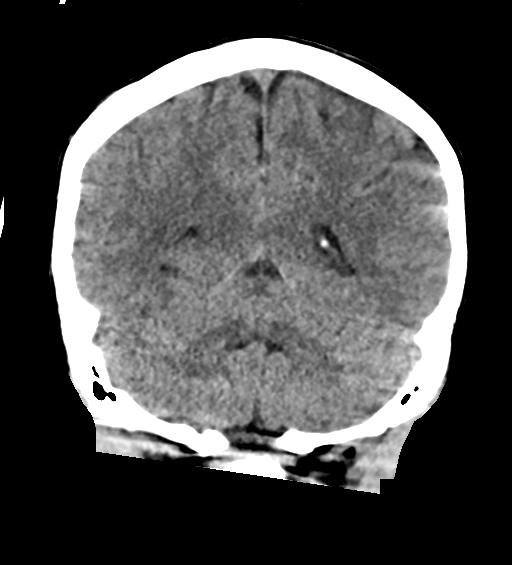
[im 30/67  brain]
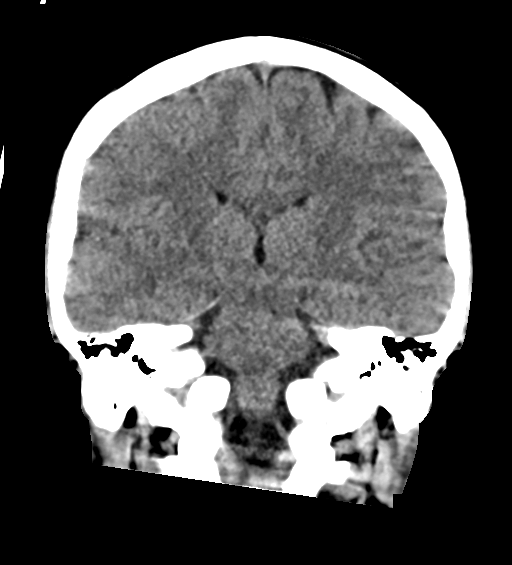
[im 37/67  brain]
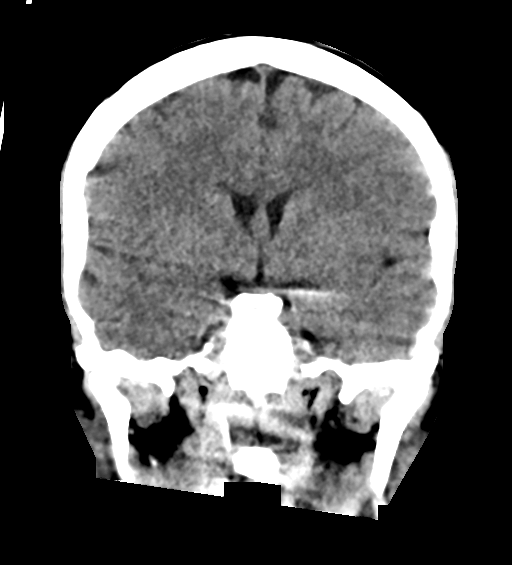

[Series 6: sag soft · sagittal · 0.37mm/px · 3 of 57 slices shown]
[im 22/57  brain]
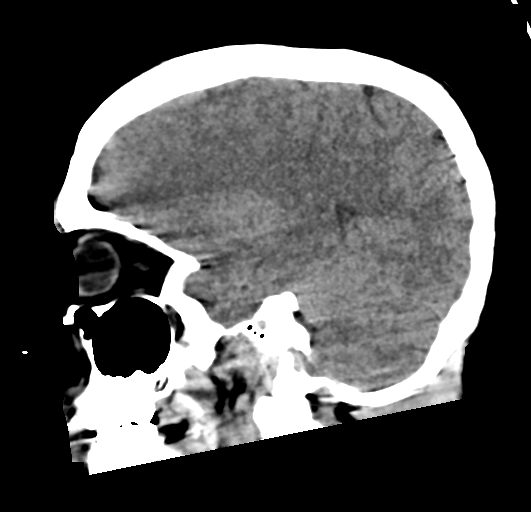
[im 29/57  brain]
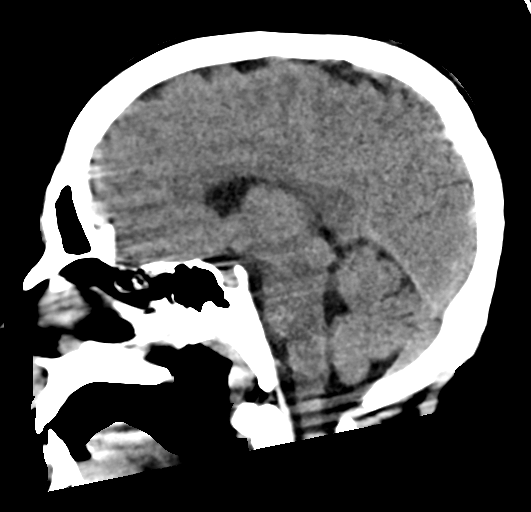
[im 37/57  brain]
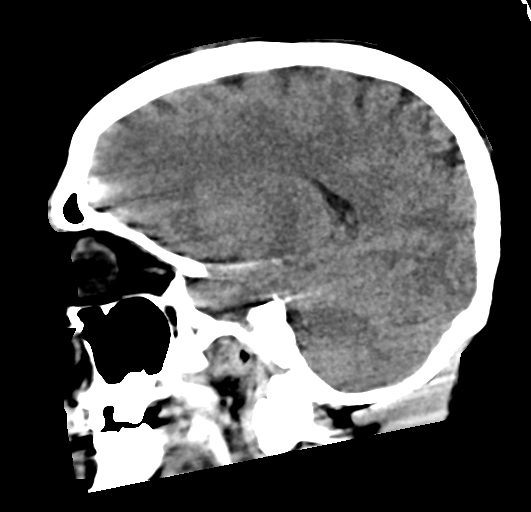

[17 of 47 positions shown; findings below may reference images not displayed]

FINDINGS: Brain: No evidence of acute infarction, hemorrhage, hydrocephalus,
extra-axial collection or mass lesion/mass effect.

Vascular: No hyperdense vessel or unexpected calcification.

Skull: Normal. Negative for fracture or focal lesion.

Sinuses/Orbits: The paranasal sinuses are clear. Mastoid air cells
are clear.

Other: None
IMPRESSION: No acute intracranial abnormalities. Normal brain.

## 2022-07-07 NOTE — ED Triage Notes (Signed)
 Patient brought in by [facility] coming from home where they got called out for psych eval. Patient was seen at [facility] earlier this month and was prescribed medications but feels like she needs more help.   Patient denies SI with EMS   112/76 100% 106

## 2022-07-07 NOTE — ED Notes (Signed)
 Pt awake    Annabella Gosling, CNA 07/07/22 1607

## 2022-07-07 NOTE — ED Notes (Signed)
 Pt calm watching tv no needs    Annabella Gosling, CNA 07/07/22 [phone]

## 2022-07-07 NOTE — ED Triage Notes (Signed)
 Patient reports feeling like non of her insides are attached to her body and that they are all going to fall out. Patient states that this has happened to her before where her lungs have become unattached.    Patient denies SI

## 2022-07-09 NOTE — Nursing Note (Signed)
 [facility] BH SHIFT SUMMARY  Patient quiet during assessment, states she was admitted for anxiety and panic attacks, states she continues to hear voices (just talking), medication compliant, no PRN's administered, patient given Haldol  Dec 150 mg today at 1210, administered to right upper/outer quadrant, no adverse reaction noted, denied pain, denied SI/HI, denied visual hallucinations, minimal interaction with peers, in/out dayroom, safety precautions in place.   Psychosocial (WDL): Exceptions to WDL (07/09/22 0900) Affect: Appropriate to circumstances (07/09/22 0900) Patient Behaviors/Mood: Calm, Cooperative (07/09/22 0900) Needs Expressed: Denies (07/09/22 0900) Emotional Support Given: Other (Comment) (Emotional support provided by staff) (07/09/22 0900)           1. Have you wished you were dead or wished you could go to sleep and not wake up? (Since Last Asked): No (07/09/22 0810) 2. Have you actually had any thoughts of killing yourself? (Since Last Asked): No (07/09/22 0810) 6. Have you done anything, started to do anything, or prepared to do anything to end your life? (Since Last Asked): No (07/09/22 0810) Calculated C-SSRS Risk Score (Since Last Asked): No Risk Indicated (07/09/22 0810)

## 2022-07-10 NOTE — Nursing Note (Addendum)
 A&O x 4, medication compliant, and no behavior issues. PRN Trazodone  administered. Denies pain, denies SI/HI, was watching tv earlier in the shift. Safety precautions in place. 15 minute checks performed. Will continue to monitor.  [facility] BH SHIFT SUMMARY   Psychosocial (WDL): Exceptions to WDL (07/09/22 2001) Affect: Appropriate to circumstances (07/09/22 2001) Patient Behaviors/Mood: Calm, Cooperative, Guarded (07/09/22 2001) Needs Expressed: Physical (07/09/22 2001)  Motor Activity: Unremarkable (07/09/22 2001) General Attitude: Cooperative, Guarded (07/09/22 2001) Appearance/Hygiene: Unremarkable (07/09/22 2001)  Coherency: Blocking (07/09/22 2001) Content: Delusions (07/09/22 2001) Perception: Derealization (07/09/22 2001) Hallucination: Auditory (07/09/22 2001) Judgment: Impaired (07/09/22 2001) Confusion: None (07/09/22 2001)     1. Have you wished you were dead or wished you could go to sleep and not wake up? (Since Last Asked): No (07/09/22 2001) 2. Have you actually had any thoughts of killing yourself? (Since Last Asked): No (07/09/22 2001) 6. Have you done anything, started to do anything, or prepared to do anything to end your life? (Since Last Asked): No (07/09/22 2001) Calculated C-SSRS Risk Score (Since Last Asked): No Risk Indicated (07/09/22 2001)

## 2022-11-12 ENCOUNTER — Encounter: Payer: Self-pay | Admitting: Internal Medicine

## 2022-11-12 ENCOUNTER — Telehealth: Payer: Self-pay | Admitting: Internal Medicine

## 2022-11-12 ENCOUNTER — Telehealth: Payer: Self-pay

## 2022-11-12 ENCOUNTER — Ambulatory Visit: Payer: MEDICAID | Attending: Internal Medicine | Admitting: Internal Medicine

## 2022-11-12 VITALS — BP 86/64 | HR 90 | Ht 64.0 in | Wt 127.0 lb

## 2022-11-12 DIAGNOSIS — F2081 Schizophreniform disorder: Secondary | ICD-10-CM

## 2022-11-12 NOTE — Patient Instructions (Signed)
Medication Instructions:  Your physician recommends that you continue on your current medications as directed. Please refer to the Current Medication list given to you today.  *If you need a refill on your cardiac medications before your next appointment, please call your pharmacy*   LFollow-Up: At [facility], you and your health needs are our priority.  As part of our continuing mission to provide you with exceptional heart care, we have created designated Provider Care Teams.  These Care Teams include your primary Cardiologist (physician) and Advanced Practice Providers (APPs -  Physician Assistants and Nurse Practitioners) who all work together to provide you with the care you need, when you need it.  We recommend signing up for the patient portal called "MyChart".  Sign up information is provided on this After Visit Summary.  MyChart is used to connect with patients for Virtual Visits (Telemedicine).  Patients are able to view lab/test results, encounter notes, upcoming appointments, etc.  Non-urgent messages can be sent to your provider as well.   To learn more about what you can do with MyChart, go to [email or web address].    Your next appointment:   As needed  Provider:

## 2022-11-12 NOTE — Telephone Encounter (Signed)
Informed by dr branch that the patients issues does not warrant a cardiology referral and she needs to be seen by her pcp

## 2022-11-12 NOTE — Telephone Encounter (Signed)
LVM to call office.

## 2022-11-12 NOTE — Progress Notes (Signed)
Cardiology Office Note:    Date:  11/12/2022   ID:  Bascom Levels Freer, DOB Oct 27, 1995, MRN [id number]  PCP:  Doran Stabler, NP   [facility] Providers Cardiologist:  None     Referring MD: No ref. provider found   No chief complaint on file. She states her heart is in her stomach  History of Present Illness:    Julie Gentry is a 27 y.o. female states her heart is in her stomach. EKG is normal.  Current Medications: Current Meds  Medication Sig   benztropine (COGENTIN) 1 MG tablet Take 1 tablet (1 mg total) by mouth 2 (two) times daily as needed for tremors.   buPROPion (WELLBUTRIN XL) 150 MG 24 hr tablet Take 150 mg by mouth daily.   DULoxetine (CYMBALTA) 60 MG capsule Take 60 mg by mouth daily.   lamoTRIgine (LAMICTAL) 25 MG tablet Take 25 mg by mouth daily.   LORazepam (ATIVAN) 1 MG tablet Take 1 mg by mouth every 8 (eight) hours.   pantoprazole (PROTONIX) 20 MG tablet Take 20 mg by mouth daily.   traZODone (DESYREL) 50 MG tablet Take 1 tablet (50 mg total) by mouth at bedtime as needed for sleep. (Patient taking differently: Take 50 mg by mouth at bedtime.)     Allergies:   Patient has no known allergies.   Social History   Socioeconomic History   Marital status: Single    Spouse name: Not on file   Number of children: Not on file   Years of education: 12   Highest education level: High school graduate  Occupational History   Occupation: Unemployed  Tobacco Use   Smoking status: Some Days    Current packs/day: 1.00    Types: Cigarettes   Smokeless tobacco: Never  Vaping Use   Vaping status: Former  Substance and Sexual Activity   Alcohol use: Not Currently   Drug use: Yes    Types: Marijuana    Comment: occasionally   Sexual activity: Yes    Birth control/protection: None  Other Topics Concern   Not on file  Social History Narrative   Pt is unemployed; lives in [place] with her father   Social Determinants of  Health   Financial Resource Strain: Medium Risk (05/24/2019)   Received from [facility], [facility]   Overall Financial Resource Strain (CARDIA)    Difficulty of Paying Living Expenses: Somewhat hard  Food Insecurity: Food Insecurity Present (05/24/2019)   Received from [facility], [facility]   Hunger Vital Sign    Worried About Running Out of Food in the Last Year: Sometimes true    Ran Out of Food in the Last Year: Sometimes true  Transportation Needs: Unmet Transportation Needs (05/24/2019)   Received from [facility], [facility]   PRAPARE - Transportation    Lack of Transportation (Medical): Yes    Lack of Transportation (Non-Medical): Yes  Physical Activity: Inactive (05/24/2019)   Received from [facility], [facility]   Exercise Vital Sign    Days of Exercise per Week: 0 days    Minutes of Exercise per Session: 0 min  Stress: Stress Concern Present (05/24/2019)   Received from [facility], [facility] of Occupational Health - Occupational Stress Questionnaire    Feeling of Stress : Rather much  Social Connections: Socially Isolated (05/24/2019)   Received from [facility], [facility], [facility]  Social Advertising account executive [NHANES]    Frequency of Communication with Friends and Family: Once a week    Frequency of Social Gatherings with Friends and Family: Once a week    Attends Religious Services: Never    Database administrator or Organizations: No    Attends Engineer, structural: Never    Marital Status: Never married     Family History: NA  ROS:   Please see the history of present illness.     All other systems reviewed and are negative.  EKGs/Labs/Other Studies Reviewed:    The following studies were reviewed today:   EKG 04/27/2022 NSR      Recent Labs: 04/27/2022: ALT 18; BUN 12; Creatinine, Ser 0.60; Hemoglobin 11.9; Hemoglobin 11.6; Platelets 432; Potassium 4.1; Potassium 4.0; Sodium  140; Sodium 139   Recent Lipid Panel    Component Value Date/Time   CHOL 178 06/01/2019 0612   TRIG 59 06/01/2019 0612   HDL 54 06/01/2019 0612   CHOLHDL 3.3 06/01/2019 0612   VLDL 12 06/01/2019 0612   LDLCALC 112 (H) 06/01/2019 0612     Risk Assessment/Calculations:        Physical Exam:    VS:  BP (!) 86/64   Pulse 90   Ht 5\' 4"  (1.626 m)   Wt 127 lb (57.6 kg)   SpO2 99%   BMI 21.80 kg/m     Wt Readings from Last 3 Encounters:  11/12/22 127 lb (57.6 kg)  04/28/22 104 lb (47.2 kg)     GEN:  Well nourished, well developed in no acute distress HEENT: Normal NECK: No JVD; No carotid bruits LYMPHATICS: No lymphadenopathy CARDIAC: RRR, no murmurs, rubs, gallops RESPIRATORY:  Clear to auscultation without rales, wheezing or rhonchi  ABDOMEN: Soft, non-tender, non-distended MUSCULOSKELETAL:  No edema; No deformity  SKIN: Warm and dry NEUROLOGIC:  Alert and oriented x 3 PSYCHIATRIC:  Normal affect   ASSESSMENT:     PLAN:    In order of problems listed above:  No further cardiac w/u needed   Medication Adjustments/Labs and Tests Ordered: Current medicines are reviewed at length with the patient today.  Concerns regarding medicines are outlined above.  No orders of the defined types were placed in this encounter.  No orders of the defined types were placed in this encounter.   There are no Patient Instructions on file for this visit.   Signed, Maisie Fus, MD  11/12/2022 2:03 PM    [facility]

## 2022-11-12 NOTE — Telephone Encounter (Signed)
Patient would like to know if Dr. Wyline Mood can order a chest xray for patient. Requesting call back to discuss.

## 2022-11-13 NOTE — Telephone Encounter (Signed)
Call to patient. She states needs a chest xray because "my heart is in the wrong place and the doctor listened to my stomach wrong"  Advised that per her visit it would not warrant an xray from cardiac standpoint and she should reach out to her PCP.  She states understanding

## 2022-11-20 ENCOUNTER — Telehealth: Payer: Self-pay | Admitting: Internal Medicine

## 2022-11-20 NOTE — Telephone Encounter (Signed)
New Message:     Patient she wants to see if Dr Wyline Mood would order an xray for her chest, and stomach. She said she saw Dr Wyline Mood a week ago.

## 2022-11-20 NOTE — Telephone Encounter (Signed)
Returned call to pt. LVM for pt to call back.

## 2022-11-20 NOTE — Telephone Encounter (Signed)
Patient called several days ago for this as well.  States "doctor listened to her stomach wrong"  Wants Xray because her "heart is in her stomach"  Advised her to call PCP.  Any further recommendations to want to order this??

## 2022-11-21 NOTE — Telephone Encounter (Signed)
Returned call to pt father. Advised father that pt needs

## 2022-11-21 NOTE — Telephone Encounter (Signed)
Spoke with pt's father and confirmed that from a Cardiac prospective everything is fine and no further workup is needed per the provider. Advised pt may be extra anxious in accordance with her schizophrenia dx. Father agreed and verbalized understanding.

## 2022-11-21 NOTE — Telephone Encounter (Signed)
Father is calling in, asking that the nurse gives him a call back. Please advise

## 2023-04-29 NOTE — ED Provider Notes (Signed)
 Patient placed in First Look pathway, seen and evaluated for chief complaint of my heart is enlarged and burned. I can smell my heart burning in my nose.  Pertinent exam findings include bizzare bejhavior. Based on initial evaluation, labs are currently indicated and radiology studies are currently indicated as allowed for current processes and treatments as applicable in a triage setting and could be different than if patient were seen in a main treatment area or dependent on labs/imagining after results are displayed.  Patient counseled on process, plan, and necessity for staying for completing the evaluation.   This document serves as a record of services personally performed by Fonda Sheffield PA-C.  The creation of this record is the providers dictation and/or activities during the visit.    Note By: Fonda Sheffield, PA-C 11:22 AM   [facility] Emergency Department Provider Note  History   Chief Complaint Chest Pain   History of Present Illness  History obtained from: Patient Provider at Bedside:  04/29/2023 12:37 PM  Julie Gentry is a 28 y.o. female who presents to the ED with complaints of chest pain. On further elaboration, patient denies chest pain but does express that she feels that her has dropped down into her stomach. Says she can smell her heart and that it smells black. Patient relays that she thinks this is due to drinking a chemical mixture from her garage. Says that this event occurred a little over a year ago.  Patient with history of schizoaffective disorder, bipolar type.  She is denying suicidal ideation, homicidal ideation, visual or auditory hallucinations at this time  ED Assessment/Plan   Patient with psych history significant for schizoaffective disorder, bipolar type presents to the ED for chief complaint documented as chest pain. On further conversation patient expresses concern for her heart having fallen  out of her chest and dropped into her stomach. Patient overall well appearing with normal vitals. She exhibits flat affect and rather tangential thought process.  On chart review of prior evaluations this appears to be her baseline when compliant with her psych medications. Medical screening labs reassuring without gross evidence of infectious process, severe dehydration, critical anemia or metabolic derangement. Discussed with BH counselor Medford Fiddler who spoke with the patient and also feels that presentation today is consistent with her baseline. She denies SI/HI. Appears to be compliant with medications and follows with psych provider outpatient. Patient was shown her chest x-ray today which did provide some reassurance of the location of her heart. She does not appear to be suffering from any medical or psychiatric emergency at this time to warrant further intervention or hospital admission. Stable for discharge home at this time. Return precautions discussed. Physical Exam  BP 126/72 (BP Location: Right arm, Patient Position: Sitting)   Pulse 86   Temp 97.8 F (36.6 C) (Oral)   Resp 19   Ht 160 cm (5' 3)   Wt 53.7 kg (118 lb 6.4 oz)   SpO2 98%   BMI 20.97 kg/m  Physical Exam Vitals and nursing note reviewed.  Constitutional:      General: She is not in acute distress.    Appearance: She is not ill-appearing, toxic-appearing or diaphoretic.  HENT:     Head: Normocephalic and atraumatic.     Mouth/Throat:     Mouth: Mucous membranes are moist.     Pharynx: Oropharynx is clear.  Eyes:     Conjunctiva/sclera: Conjunctivae normal.     Pupils: Pupils are  equal, round, and reactive to light.  Cardiovascular:     Rate and Rhythm: Normal rate and regular rhythm.     Pulses: Normal pulses.     Heart sounds: Normal heart sounds.  Pulmonary:     Effort: Pulmonary effort is normal. No respiratory distress.     Breath sounds: Normal breath sounds.  Abdominal:     General: Bowel sounds are  normal. There is no distension.     Palpations: Abdomen is soft.     Tenderness: There is no abdominal tenderness.  Musculoskeletal:        General: Normal range of motion.     Cervical back: Normal range of motion and neck supple.  Skin:    General: Skin is warm and dry.     Capillary Refill: Capillary refill takes less than 2 seconds.  Neurological:     General: No focal deficit present.     Mental Status: She is alert and oriented to person, place, and time. Mental status is at baseline.  Psychiatric:        Mood and Affect: Mood normal. Affect is flat.        Speech: Speech is tangential.        Behavior: Behavior normal. Behavior is cooperative.        Thought Content: Thought content does not include homicidal or suicidal ideation.    Procedures  Procedures ED Interventions  ED Medications Administered: These medications and interventions were provided for the patient while in the ED. Medications - No data to display  Medical Decision Making  Ddx: includes but not limited to: asthma, pneumonia, pneumothorax, psychosis  Interpretation of Diagnostics: I personally reviewed the patient's ekg/lab tests/imaging and my interpretation is as follows:  --CBC without significant leukocytosis or anemia. Chemistry without AKI, anion gap, signs of dehydration, or other significant electrolyte or metabolic derrangements.  --UA not consistent with infection --chest x-ray negative for pneumonia   Past medical/surgical history that increases complexity of ED encounter: schizoaffective disorder and suicidal attempts increasing risk of morbidity/mortality  Patient management required discussion with the following services or consulting groups: BH counselor   Patient's presentation is most consistent with acute complicated illness / injury requiring diagnostic workup.  Factors Impacting ED Encounter Risk: none  External Records Reviewed: ED encounters dated 07/07/22 and 04/27/2022 ED  Clinical Impression   1. Feared condition not demonstrated    ED Disposition   ED Disposition     ED Disposition  Discharge   Condition  Stable   Comment  --

## 2024-05-07 ENCOUNTER — Emergency Department (HOSPITAL_COMMUNITY)
Admission: EM | Admit: 2024-05-07 | Discharge: 2024-05-07 | Disposition: A | Discharge status: Home / Self Care | Payer: MEDICAID

## 2024-05-07 ENCOUNTER — Encounter (HOSPITAL_COMMUNITY): Payer: Self-pay

## 2024-05-07 ENCOUNTER — Emergency Department (HOSPITAL_COMMUNITY): Payer: MEDICAID

## 2024-05-07 ENCOUNTER — Other Ambulatory Visit: Payer: Self-pay

## 2024-05-07 DIAGNOSIS — Z711 Person with feared health complaint in whom no diagnosis is made: Secondary | ICD-10-CM | POA: Insufficient documentation

## 2024-05-07 DIAGNOSIS — F419 Anxiety disorder, unspecified: Secondary | ICD-10-CM | POA: Diagnosis present

## 2024-05-07 LAB — URINALYSIS, ROUTINE W REFLEX MICROSCOPIC
Bilirubin Urine: NEGATIVE
Glucose, UA: NEGATIVE mg/dL
Ketones, ur: NEGATIVE mg/dL
Leukocytes,Ua: NEGATIVE
Nitrite: NEGATIVE
Protein, ur: NEGATIVE mg/dL
Specific Gravity, Urine: 1.016 (ref 1.005–1.030)
pH: 7 (ref 5.0–8.0)

## 2024-05-07 LAB — BASIC METABOLIC PANEL WITH GFR
Anion gap: 9 (ref 5–15)
BUN: 7 mg/dL (ref 6–20)
CO2: 25 mmol/L (ref 22–32)
Calcium: 9.1 mg/dL (ref 8.9–10.3)
Chloride: 104 mmol/L (ref 98–111)
Creatinine, Ser: 0.68 mg/dL (ref 0.44–1.00)
GFR, Estimated: >60 mL/min
Glucose, Bld: 85 mg/dL (ref 70–99)
Potassium: 4 mmol/L (ref 3.5–5.1)
Sodium: 138 mmol/L (ref 135–145)

## 2024-05-07 LAB — CBC WITH DIFFERENTIAL/PLATELET
Abs Immature Granulocytes: 0.01 K/uL (ref 0.00–0.07)
Basophils Absolute: 0.1 K/uL (ref 0.0–0.1)
Basophils Relative: 1 %
Eosinophils Absolute: 0.2 K/uL (ref 0.0–0.5)
Eosinophils Relative: 4 %
HCT: 37.1 % (ref 36.0–46.0)
Hemoglobin: 12.6 g/dL (ref 12.0–15.0)
Immature Granulocytes: 0 %
Lymphocytes Relative: 38 %
Lymphs Abs: 2.2 K/uL (ref 0.7–4.0)
MCH: 29.6 pg (ref 26.0–34.0)
MCHC: 34 g/dL (ref 30.0–36.0)
MCV: 87.1 fL (ref 80.0–100.0)
Monocytes Absolute: 0.6 K/uL (ref 0.1–1.0)
Monocytes Relative: 10 %
Neutro Abs: 2.6 K/uL (ref 1.7–7.7)
Neutrophils Relative %: 47 %
Platelets: 375 K/uL (ref 150–400)
RBC: 4.26 MIL/uL (ref 3.87–5.11)
RDW: 11.8 % (ref 11.5–15.5)
WBC: 5.7 K/uL (ref 4.0–10.5)
nRBC: 0 % (ref 0.0–0.2)

## 2024-05-07 LAB — TROPONIN T, HIGH SENSITIVITY: Troponin T High Sensitivity: <15 ng/L (ref 0–19)

## 2024-05-07 NOTE — Discharge Instructions (Addendum)
 Today you were seen for anxiety.  Your workup while in the emergency department was reassuring.  Thank you for letting us  treat you today. After performing a physical exam, I feel you are safe to go home. Please follow up with your PCP in the next several days and provide them with your records from this visit. Return to the Emergency Room if pain becomes severe or symptoms worsen.

## 2024-05-07 NOTE — ED Triage Notes (Signed)
 Pt BIB [facility] d/t possible anxiety.   Pt stated she had a big heart, could not catch her breath and tingling everywhere.   BP 118/64 HR 99 98% RA CBG 101 RR18

## 2024-05-07 NOTE — ED Provider Notes (Signed)
 " [facility] Provider Note   CSN: [id number] Arrival date & time: 05/07/24  9780     Patient presents with: Anxiety   Julie Gentry is a 29 y.o. female.  Presents today via EMS stating she had an enlarged heart.  Patient was hyperventilating and could not catch her breath and felt tingling everywhere.  Patient reports feeling like her artery was enlarged and she could feel her heartbeat.  Patient denies any symptoms at this time.    Anxiety       Prior to Admission medications  Medication Sig Start Date End Date Taking? Authorizing Provider  benztropine  (COGENTIN ) 1 MG tablet Take 1 tablet (1 mg total) by mouth 2 (two) times daily as needed for tremors. 06/14/19   Wonda Clarita BRAVO, NP  buPROPion  (WELLBUTRIN  XL) 150 MG 24 hr tablet Take 150 mg by mouth daily. 03/31/22   [provider]  DULoxetine (CYMBALTA) 60 MG capsule Take 60 mg by mouth daily.    [provider]  FLUoxetine  (PROZAC ) 10 MG capsule Take 1 capsule (10 mg total) by mouth daily. Patient not taking: Reported on 11/12/2022 06/15/19   Wonda Clarita BRAVO, NP  furosemide  (LASIX ) 20 MG tablet Take 20 mg by mouth daily. Patient not taking: Reported on 11/12/2022 04/16/22   [provider]  lamoTRIgine (LAMICTAL) 25 MG tablet Take 25 mg by mouth daily.    [provider]  LORazepam  (ATIVAN ) 1 MG tablet Take 1 mg by mouth every 8 (eight) hours.    [provider]  nicotine  (NICODERM CQ  - DOSED IN MG/24 HOURS) 21 mg/24hr patch Place 1 patch (21 mg total) onto the skin daily. Patient not taking: Reported on 11/12/2022 06/15/19   Wonda Clarita BRAVO, NP  omega-3 acid ethyl esters (LOVAZA ) 1 g capsule Take 1 capsule (1 g total) by mouth 2 (two) times daily. Patient not taking: Reported on 11/12/2022 06/14/19   Sykes, Janet E, NP  pantoprazole (PROTONIX) 20 MG tablet Take 20 mg by mouth daily.    [provider]  perphenazine  (TRILAFON ) 2  MG tablet Take 3 tablets (6 mg total) by mouth 3 (three) times daily. Patient not taking: Reported on 11/12/2022 06/14/19   Wonda Clarita BRAVO, NP  Prenatal Vit-Fe Fumarate-FA (PRENATAL MULTIVITAMIN) TABS tablet Take 1 tablet by mouth daily. Patient not taking: Reported on 11/12/2022 06/15/19   Wonda Clarita BRAVO, NP  traZODone  (DESYREL ) 50 MG tablet Take 1 tablet (50 mg total) by mouth at bedtime as needed for sleep. Patient taking differently: Take 50 mg by mouth at bedtime. 06/14/19   Sykes, Janet E, NP    Allergies: Patient has no known allergies.    Review of Systems  Psychiatric/Behavioral:  The patient is nervous/anxious.     Updated Vital Signs BP 123/85 (BP Location: Right Arm)   Pulse 92   Temp 98.3 F (36.8 C) (Oral)   Resp 17   Ht 5' 4 (1.626 m)   Wt 57.4 kg   SpO2 100%   BMI 21.72 kg/m   Physical Exam Vitals and nursing note reviewed.  Constitutional:      General: She is not in acute distress.    Appearance: She is well-developed. She is not toxic-appearing.  HENT:     Head: Normocephalic and atraumatic.     Right Ear: External ear normal.     Left Ear: External ear normal.  Eyes:     Conjunctiva/sclera: Conjunctivae normal.  Cardiovascular:  Rate and Rhythm: Normal rate and regular rhythm.     Pulses: Normal pulses.     Heart sounds: Normal heart sounds. No murmur heard. Pulmonary:     Effort: Pulmonary effort is normal. No respiratory distress.     Breath sounds: Normal breath sounds.  Abdominal:     Palpations: Abdomen is soft.     Tenderness: There is no abdominal tenderness.  Musculoskeletal:        General: No swelling.     Cervical back: Neck supple. No rigidity.  Skin:    General: Skin is warm and dry.     Capillary Refill: Capillary refill takes less than 2 seconds.  Neurological:     General: No focal deficit present.     Mental Status: She is alert and oriented to person, place, and time.  Psychiatric:        Mood and Affect: Mood is anxious.         Speech: Speech is tangential.     (all labs ordered are listed, but only abnormal results are displayed) Labs Reviewed  URINALYSIS, ROUTINE W REFLEX MICROSCOPIC - Abnormal; Notable for the following components:      Result Value   APPearance CLOUDY (*)    Hgb urine dipstick MODERATE (*)    Bacteria, UA FEW (*)    All other components within normal limits  BASIC METABOLIC PANEL WITH GFR  CBC WITH DIFFERENTIAL/PLATELET  TROPONIN T, HIGH SENSITIVITY    EKG: EKG Interpretation Date/Time:  Saturday May 07 2024 03:57:55 EST Ventricular Rate:  82 PR Interval:  118 QRS Duration:  86 QT Interval:  374 QTC Calculation: 436 R Axis:   90  Text Interpretation: Normal sinus rhythm Rightward axis Borderline ECG When compared with ECG of 27-Apr-2022 10:58, PREVIOUS ECG IS PRESENT Confirmed by Simon Rea [phone]) on 05/07/2024 4:02:34 AM  Radiology: [facility] Chest 2 View Result Date: 05/07/2024 EXAM: 2 VIEW(S) XRAY OF THE CHEST 05/07/2024 03:42:59 AM COMPARISON: 04/28/2024 CLINICAL HISTORY: Chest pain FINDINGS: LUNGS AND PLEURA: No focal pulmonary opacity. No pleural effusion. No pneumothorax. HEART AND MEDIASTINUM: No acute abnormality of the cardiac and mediastinal silhouettes. BONES AND SOFT TISSUES: No acute osseous abnormality. IMPRESSION: 1. No acute cardiopulmonary pathology. Electronically signed by: Evalene Coho MD 05/07/2024 03:56 AM EST RP Workstation: [id number]     Procedures   Medications Ordered in the ED - No data to display                                  Medical Decision Making  This patient presents to the ED for concern of anxiety differential diagnosis includes SI, HI, AVH, anxiety, psychosis    Additional history obtained   Additional history obtained from Electronic Medical Record External records from outside source obtained and reviewed including schizophreniform disorder   Lab Tests:  I Ordered, and personally interpreted labs.  The pertinent  results include: CBC unremarkable, UA with moderate hemoglobin and few bacteria, BMP unremarkable, troponin negative   Imaging Studies ordered:  I ordered imaging studies including chest x-ray I independently visualized and interpreted imaging which showed no acute cardiopulmonary pathology I agree with the radiologist interpretation EKG: Sinus rhythm    Problem List / ED Course:  Considered for admission or further workup however patient's vital signs, physical exam, labs, and imaging are reassuring.  Patient has history of similar complaints worked up in the ER approximately 1 year ago.  I suspect her anxiety secondary to her psychiatric diagnoses may be playing a part of her symptoms.  Patient advised to follow-up with her PCP for further evaluation and workup if her symptoms persist.  I feel patient is safe for discharge at this time.       Final diagnoses:  Anxiety  Feared condition not demonstrated    ED Discharge Orders     None          Francis Ileana SAILOR, PA-C 05/07/24 0418    Simon Lavonia SAILOR, MD 05/07/24 [phone]  "
